# Patient Record
Sex: Male | Born: 1990 | Race: Black or African American | Hispanic: No | Marital: Married | Smoking: Never smoker
Health system: Southern US, Community
[De-identification: ages and names within clinical notes are randomized; demographics above are authoritative.]

## PROBLEM LIST (undated history)

## (undated) HISTORY — PX: NO PAST SURGERIES: SHX2092

---
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
## 2022-03-20 ENCOUNTER — Encounter (HOSPITAL_COMMUNITY): Payer: Self-pay

## 2022-03-20 ENCOUNTER — Ambulatory Visit (HOSPITAL_COMMUNITY)
Admission: EM | Admit: 2022-03-20 | Discharge: 2022-03-20 | Disposition: A | Discharge status: Home / Self Care | Payer: 59 | Attending: Physician Assistant | Admitting: Physician Assistant

## 2022-03-20 DIAGNOSIS — K625 Hemorrhage of anus and rectum: Secondary | ICD-10-CM | POA: Insufficient documentation

## 2022-03-20 LAB — CBC
HCT: 42.9 % (ref 39.0–52.0)
Hemoglobin: 14.7 g/dL (ref 13.0–17.0)
MCH: 29.5 pg (ref 26.0–34.0)
MCHC: 34.3 g/dL (ref 30.0–36.0)
MCV: 86.1 fL (ref 80.0–100.0)
Platelets: 465 10*3/uL — ABNORMAL HIGH (ref 150–400)
RBC: 4.98 MIL/uL (ref 4.22–5.81)
RDW: 12 % (ref 11.5–15.5)
WBC: 5.5 10*3/uL (ref 4.0–10.5)
nRBC: 0 % (ref 0.0–0.2)

## 2022-03-20 MED ORDER — HYDROCORTISONE ACETATE 25 MG RE SUPP: 25.0000 mg | Freq: Two times a day (BID) | RECTAL | 0 refills | Status: DC

## 2022-03-20 NOTE — ED Provider Notes (Signed)
?[facility] ? ? ? ?CSN: [id number] ?Arrival date & time: 03/20/22  [id number] ? ? ?  ? ?History   ?Chief Complaint ?Chief Complaint  ?Patient presents with  ? Rectal Bleeding  ? ? ?HPI ?Antonio GuileJohn E Castro Jr. is a 32 y.o. male.  ? ?Patient presents today with a an episode of bright red blood per rectum that occurred yesterday.  Reports he felt the urge to defecate but was unable to pass any stool and when he went to wipe.  Blood on toilet tissue.  A small amount fell into the toilet bowl but it was not significant enough to change the color of water in the toilet bowl.  He denies any associated pain, nausea, vomiting, diarrhea, constipation.  He does have a history of irritable bowel as well as GERD and is followed by GI.  He has had colonoscopy and endoscopy that showed hemorrhoids but no other concerning findings.  He does not take blood thinning medication denies history of bleeding disorder.  Denies any chest pain, shortness of breath, lightheadedness, nausea, vomiting.  He denies history of ulcerative colitis or Crohn's disease.  He reports having a normal bowel movement without any associated bleeding earlier today. ? ? ?History reviewed. No pertinent past medical history. ? ?There are no problems to display for this patient. ? ? ?History reviewed. No pertinent surgical history. ? ? ? ? ?Home Medications   ? ?Prior to Admission medications   ?Medication Sig Start Date End Date Taking? Authorizing Provider  ?hydrocortisone (ANUSOL-HC) 25 MG suppository Place 1 suppository (25 mg total) rectally 2 (two) times daily. 03/20/22  Yes Lakeita Panther, Noberto RetortErin K, PA-C  ? ? ?Family History ?Family History  ?Problem Relation Age of Onset  ? Diabetes Mother   ? Diabetes Father   ? Hypertension Father   ? ? ?Social History ?Social History  ? ?Tobacco Use  ? Smoking status: Never  ? Smokeless tobacco: Never  ?Vaping Use  ? Vaping Use: Every day  ?Substance Use Topics  ? Alcohol use: Not Currently  ?  Comment: 4/7 days a week.  ? Drug  use: Never  ? ? ? ?Allergies   ?Kiwi extract ? ? ?Review of Systems ?Review of Systems  ?Constitutional:  Positive for activity change. Negative for appetite change, fatigue and fever.  ?Respiratory:  Negative for cough and shortness of breath.   ?Cardiovascular:  Negative for chest pain and palpitations.  ?Gastrointestinal:  Positive for blood in stool. Negative for abdominal pain, constipation, diarrhea, nausea, rectal pain and vomiting.  ?Neurological:  Negative for dizziness, weakness, light-headedness and headaches.  ? ? ?Physical Exam ?Triage Vital Signs ?ED Triage Vitals  ?Enc Vitals Group  ?   BP 03/20/22 1113 (!) 153/57  ?   Pulse Rate 03/20/22 1113 62  ?   Resp 03/20/22 1113 16  ?   Temp 03/20/22 1113 98.6 ?F (37 ?C)  ?   Temp Source 03/20/22 1113 Oral  ?   SpO2 03/20/22 1113 97 %  ?   Weight --   ?   Height --   ?   Head Circumference --   ?   Peak Flow --   ?   Pain Score 03/20/22 1110 0  ?   Pain Loc --   ?   Pain Edu? --   ?   Excl. in GC? --   ? ?No data found. ? ?Updated Vital Signs ?BP (!) 153/57 (BP Location: Left Arm)   Pulse 62  Temp 98.6 ?F (37 ?C) (Oral)   Resp 16   SpO2 97%  ? ?Visual Acuity ?Right Eye Distance:   ?Left Eye Distance:   ?Bilateral Distance:   ? ?Right Eye Near:   ?Left Eye Near:    ?Bilateral Near:    ? ?Physical Exam ?Vitals reviewed.  ?Constitutional:   ?   General: He is awake.  ?   Appearance: Normal appearance. He is well-developed. He is not ill-appearing.  ?   Comments: Very pleasant male appears stated age in no acute distress sitting comfortably in exam room  ?HENT:  ?   Head: Normocephalic and atraumatic.  ?   Mouth/Throat:  ?   Pharynx: No oropharyngeal exudate, posterior oropharyngeal erythema or uvula swelling.  ?Cardiovascular:  ?   Rate and Rhythm: Normal rate and regular rhythm.  ?   Heart sounds: Normal heart sounds, S1 normal and S2 normal. No murmur heard. ?Pulmonary:  ?   Effort: Pulmonary effort is normal.  ?   Breath sounds: Normal breath sounds. No  stridor. No wheezing, rhonchi or rales.  ?   Comments: Clear auscultation bilaterally ?Abdominal:  ?   General: Bowel sounds are normal.  ?   Palpations: Abdomen is soft.  ?   Tenderness: There is no abdominal tenderness. There is no right CVA tenderness, left CVA tenderness, guarding or rebound.  ?   Comments: Benign abdominal exam  ?Genitourinary: ?   Rectum: No mass, tenderness, anal fissure, external hemorrhoid or internal hemorrhoid.  ?   Comments: Dee, CMA present as chaperone during exam. ?Neurological:  ?   Mental Status: He is alert.  ?Psychiatric:     ?   Behavior: Behavior is cooperative.  ? ? ? ?UC Treatments / Results  ?Labs ?(all labs ordered are listed, but only abnormal results are displayed) ?Labs Reviewed  ?CBC  ? ? ?EKG ? ? ?Radiology ?No results found. ? ?Procedures ?Procedures (including critical care time) ? ?Medications Ordered in UC ?Medications - No data to display ? ?Initial Impression / Assessment and Plan / UC Course  ?I have reviewed the triage vital signs and the nursing notes. ? ?Pertinent labs & imaging results that were available during my care of the patient were reviewed by me and considered in my medical decision making (see chart for details). ? ?  ? ?Physical exam is benign.  Patient is well-appearing, afebrile, nontachycardic, nontoxic.  Unclear etiology of symptoms but no evidence of persistent bleeding.  CBC obtained today-results pending.  Discussed that symptoms could be related to hemorrhoids and he was prescribed hydrocortisone suppositories to be used twice daily.  Recommended he avoid constipation by increasing fiber and fluid.  He is currently established with GI specialist and was encouraged to call to schedule an appointment with them as soon as possible.  Discussed that if he has any recurrent or worsening symptoms he needs to go to to the emergency room.  Strict return precautions given to which he expressed understanding.  Work excuse note provided. ? ?Final  Clinical Impressions(s) / UC Diagnoses  ? ?Final diagnoses:  ?BRBPR (bright red blood per rectum)  ? ? ? ?Discharge Instructions   ? ?  ?I will contact you if your blood counts are abnormal.  Please increase fiber in your diet and drink plenty fluids to avoid constipation which could exacerbate symptoms.  Use hydrocortisone suppositories to manage any hemorrhoids.  Please call your GI specialist to schedule an appointment as soon as possible.  If you  have recurrent episodes or worsening symptoms you need to be reevaluated. ? ? ? ? ?ED Prescriptions   ? ? Medication Sig Dispense Auth. Provider  ? hydrocortisone (ANUSOL-HC) 25 MG suppository Place 1 suppository (25 mg total) rectally 2 (two) times daily. 12 suppository Alyshia Kernan K, PA-C  ? ?  ? ?PDMP not reviewed this encounter. ?  ?Jeani Hawking, PA-C ?03/20/22 1154 ? ?

## 2022-03-20 NOTE — Discharge Instructions (Signed)
I will contact you if your blood counts are abnormal.  Please increase fiber in your diet and drink plenty fluids to avoid constipation which could exacerbate symptoms.  Use hydrocortisone suppositories to manage any hemorrhoids.  Please call your GI specialist to schedule an appointment as soon as possible.  If you have recurrent episodes or worsening symptoms you need to be reevaluated. ?

## 2022-03-20 NOTE — ED Triage Notes (Signed)
Pt presents with c/o rectal bleeding since last night.  ? ?Pt states he thought he had a BM and states he only saw blood. Pt denies rectal pain. Reports diarrhea, denies N/V.  ?

## 2022-04-19 NOTE — Progress Notes (Signed)
? ? ? ?04/20/2022 ?Caralyn Guile. ?[id number] ?07-31-1990 ? ?Primary GI doctor: Dr. Retia Passe ? ?ASSESSMENT AND PLAN:  ? ?Harpreet was seen today for rectal bleeding. ? ?Hemorrhoids, unspecified hemorrhoid type ?-     hydrocortisone (ANUSOL-HC) 2.5 % rectal cream; Place 1 application. rectally 2 (two) times daily as needed for hemorrhoids. ?-Sitz baths, increase fiber, increase water, need to fix toilet habits.  ?-Hydrocortisone supp give and external cream sent in.  ?-We discussed hemorrhoid banding here in the office treatment if hemorrhoids do not improve with conservative treatment. ?- follow up for evaluation with Dr. Adela Lank, can evaluate for possible banding in the office. ? ?Chronic idiopathic constipation ?-     DG Abd 1 View; Future ?07/01/2020 Colon pathology reviewed showed colonic mucosa random biopsies negative, hemorhroids. EGD neg H pylori gastiris, normal small bowel no celiac, negative EOE some mild esophagitis.  ?- Increase fiber/ water intake, decrease caffeine, increase activity level. ?? From working out less, normal colon 2021 other than hemorrhoids ?-Will add on Miralax daily and Benefiber- if not helping can consider linzess. ? ?HISTORY OF PRESENT ILLNESS: ?32 y.o. male with a past medical history of GERD, IBS and others listed below, presents for ER follow up on 03/20/22 for rectal bleeding.  ? ?He is out of the [employer] x 3 months.  ?For the past month he has had worsening constipation, BM q 3 days, loose.  ?2 Days ago he had mucus and BRB in the toliet with BM.  ?No straining.  ?No rectal pain.  ?No new medications, no supplements.  ?He has not been working out as much, diet is the same, has tried to increase water.  ?He has had gas, AB bloating. No AB pain.  ?Did no pick up suppositories from ER.  ?Has history of GERD but none at this time.  ?No weight loss.  ? ?CBC shows hemoglobin 14.7, white blood cell count 5.5, platelets 465 ?[facility]   ?07/01/2020 Colon pathology reviewed showed colonic mucosa random biopsies negative, hemorhroids. EGD neg H pylori gastiris, normal small bowel no celiac, negative EOE some mild esophagitis.  ? ? ?Current Medications:  ? ? ? ? ? ? ?Current Outpatient Medications (Other):  ?  hydrocortisone (ANUSOL-HC) 2.5 % rectal cream, Place 1 application. rectally 2 (two) times daily as needed for hemorrhoids. ?  hydrocortisone (ANUSOL-HC) 25 MG suppository, Place 1 suppository (25 mg total) rectally 2 (two) times daily. ? ?Medical History: History reviewed. No pertinent past medical history. ?Allergies:  ?Allergies  ?Allergen Reactions  ? Kiwi Extract   ?  ? ?Surgical History:  ?He  has a past surgical history that includes No past surgeries. ?Family History:  ?His family history includes Diabetes in his father and mother; Hypertension in his father. ?Social History:  ? reports that he has never smoked. He has never used smokeless tobacco. He reports current alcohol use. He reports that he does not use drugs. ? ?REVIEW OF SYSTEMS  : All other systems reviewed and negative except where noted in the History of Present Illness. ? ? ?PHYSICAL EXAM: ?BP 126/74   Pulse 67   Ht 5\' 9"  (1.753 m)   Wt 214 lb 12.8 oz (97.4 kg)   SpO2 98%   BMI 31.72 kg/m?  ?General:   Pleasant, well developed male in no acute distress ?Head:  Normocephalic and atraumatic. ?Eyes: sclerae anicteric,conjunctive pink  ?Heart:  regular rate and rhythm ?Pulm: Clear anteriorly; no wheezing ?Abdomen:  Soft, Non-distended AB, Active bowel sounds. No tenderness . , No organomegaly appreciated. ?Rectal: Normal external rectal exam, normal rectal tone, appreciated internal hemorrhoids, non-tender, no masses, , brown stool, hemoccult Negative ?Extremities:  Without edema. ?Msk:  Symmetrical without gross deformities. Peripheral pulses intact.  ?Neurologic:  Alert and  oriented x4;  No focal deficits.  ?Skin:   Dry and intact without significant lesions or  rashes. ?Psychiatric: Cooperative. Normal mood and affect. ? ? ? ?Doree Albee, PA-C ?12:00 PM ? ? ?

## 2022-04-20 ENCOUNTER — Ambulatory Visit: Payer: 59 | Admitting: Physician Assistant

## 2022-04-20 ENCOUNTER — Ambulatory Visit (INDEPENDENT_AMBULATORY_CARE_PROVIDER_SITE_OTHER)
Admission: RE | Admit: 2022-04-20 | Discharge: 2022-04-20 | Disposition: A | Discharge status: Home / Self Care | Payer: 59 | Source: Ambulatory Visit | Attending: Physician Assistant | Admitting: Physician Assistant

## 2022-04-20 ENCOUNTER — Encounter: Payer: Self-pay | Admitting: Physician Assistant

## 2022-04-20 VITALS — BP 126/74 | HR 67 | Ht 69.0 in | Wt 214.8 lb

## 2022-04-20 DIAGNOSIS — K5904 Chronic idiopathic constipation: Secondary | ICD-10-CM

## 2022-04-20 DIAGNOSIS — K649 Unspecified hemorrhoids: Secondary | ICD-10-CM

## 2022-04-20 MED ORDER — HYDROCORTISONE (PERIANAL) 2.5 % EX CREA: 1.0000 "application " | TOPICAL_CREAM | Freq: Two times a day (BID) | CUTANEOUS | 2 refills | Status: DC | PRN

## 2022-04-20 NOTE — Patient Instructions (Addendum)
Recommend starting on a fiber supplement, can try metamucil first but if this causes gas/bloating switch to benefiber ?Take with fiber with with a full 8 oz glass of water once a day. This can take 1 month to start helping, so try for at least one month.  ?Recommend increasing water and activity.  ?Get a squatty potty to use at home or try a stool, goal is to get your knees above your hips during a bowel movement. ? ?Miralax is an osmotic laxative.  ?It only brings more water into the stool.  ?This is safe to take daily.  ?Can take up to 17 gram of miralax twice a day.  ?Mix with juice or coffee.  ?Start 1/2- 1 capful at night for 3-4 days and reassess your response in 3-4 days.  ?You can increase and decrease the dose based on your response.  ?Remember, it can take up to 3-4 days to take effect OR for the effects to wear off.  ? ?I often pair this with benefiber in the morning to help assure the stool is not too loose.  ? ?- Drink at least 64-80 ounces of water/liquid per day. ?- Establish a time to try to move your bowels every day.  For many people, this is after a cup of coffee or after a meal such as breakfast. ?- Sit all of the way back on the toilet keeping your back fairly straight and while sitting up, try to rest the tops of your forearms on your upper thighs.   ?- Raising your feet with a step stool/squatty potty can be helpful to improve the angle that allows your stool to pass through the rectum. ?- Relax the rectum feeling it bulge toward the toilet water.  If you feel your rectum raising toward your body, you are contracting rather than relaxing. ?- Breathe in and slowly exhale. "Belly breath" by expanding your belly towards your belly button. Keep belly expanded as you gently direct pressure down and back to the anus.  A low pitched GRRR sound can assist with increasing intra-abdominal pressure.  ?- Repeat 3-4 times. If unsuccessful, contract the pelvic floor to restore normal tone and get off the  toilet.  Avoid excessive straining. ?- To reduce excessive wiping by teaching your anus to normally contract, place hands on outer aspect of knees and resist knee movement outward.  Hold 5-10 second then place hands just inside of knees and resist inward movement of knees.  Hold 5 seconds.  Repeat a few times each way. ? ?Go to the ER if unable to pass gas, severe AB pain, unable to hold down food, any shortness of breath of chest pain.  ? ?If the hemorrhoid suppository sent in is too expensive you can do this over the counter trick.  ?Apply a pea size amount of over the counter Anusol HC cream to the tip of an over the counter PrepH suppository and insert rectally once every night for at least 7 nights.  ? ?Please do sitz baths, increase fiber or add benefiber, increase water and increase acitivity.  ?Will send in hydrocoritsone suppository, cheapest with [employer] from [employer], [employer], [employer] or [employer] if your insurance does not pay for it. ? ?About Hemorrhoids ? ?Hemorrhoids are swollen veins in the lower rectum and anus.  Also called piles, hemorrhoids are a common problem.  Hemorrhoids may be internal (inside the rectum) or external (around the anus). ? ?Internal Hemorrhoids ? ?Internal hemorrhoids are often painless, but they rarely  cause bleeding.  The internal veins may stretch and fall down (prolapse) through the anus to the outside of the body.  The veins may then become irritated and painful. ? ?External Hemorrhoids ? ?External hemorrhoids can be easily seen or felt around the anal opening.  They are under the skin around the anus.  When the swollen veins are scratched or broken by straining, rubbing or wiping they sometimes bleed. ? ?How Hemorrhoids Occur ? ?Veins in the rectum and around the anus tend to swell under pressure.  Hemorrhoids can result from increased pressure in the veins of your anus or rectum.  Some sources of pressure are: ? ?Straining to have a bowel movement because of  constipation ?Waiting too long to have a bowel movement ?Coughing and sneezing often ?Sitting for extended periods of time, including on the toilet ?Diarrhea ?Obesity ?Trauma or injury to the anus ?Some liver diseases ?Stress ?Family history of hemorrhoids ?Pregnancy ? Pregnant women should try to avoid becoming constipated, because they are more likely to have hemorrhoids during pregnancy.  In the last trimester of pregnancy, the enlarged uterus may press on blood vessels and causes hemorrhoids.  In addition, the strain of childbirth sometimes causes hemorrhoids after the birth. ? ?Symptoms of Hemorrhoids ? ?Some symptoms of hemorrhoids include: ?Swelling and/or a tender lump around the anus ?Itching, mild burning and bleeding around the anus ?Painful bowel movements with or without constipation ?Bright red blood covering the stool, on toilet paper or in the toilet bowel.  ? ?Symptoms usually go away within a few days.  Always talk to your doctor about any bleeding to make sure it is not from some other causes. ? ?Diagnosing and Treating Hemorrhoids ? ?Diagnosis is made by an examination by your healthcare provider.  Special test can be performed by your doctor.   ? ?Most cases of hemorrhoids can be treated with: ?High-fiber diet: Eat more high-fiber foods, which help prevent constipation.  Ask for more detailed fiber information on types and sources of fiber from your healthcare provider. ?Fluids: Drink plenty of water.  This helps soften bowel movements so they are easier to pass. ?Sitz baths and cold packs: Sitting in lukewarm water two or three times a day for 15 minutes cleases the anal area and may relieve discomfort.  If the water is too hot, swelling around the anus will get worse.  Placing a cloth-covered ice pack on the anus for ten minutes four times a day can also help reduce selling.  Gently pushing a prolapsed hemorrhoid back inside after the bath or ice pack can be helpful. ?Medications: For mild  discomfort, your healthcare provider may suggest over-the-counter pain medication or prescribe a cream or ointment for topical use.  The cream may contain witch hazel, zinc oxide or petroleum jelly.  Medicated suppositories are also a treatment option.  Always consult your doctor before applying medications or creams. ?Procedures and surgeries: There are also a number of procedures and surgeries to shrink or remove hemorrhoids in more serious cases.  Talk to your physician about these options. ? ?You can often prevent hemorrhoids or keep them from becoming worse by maintaining a healthy lifestyle.  Eat a fiber-rich diet of fruits, vegetables and whole grains.  Also, drink plenty of water and exercise regularly. ? ?? 2007, [employer] Doc.30 ? ?

## 2022-04-20 NOTE — Progress Notes (Signed)
Agree with assessment and plan as outlined.  

## 2022-06-20 ENCOUNTER — Ambulatory Visit: Payer: 59 | Admitting: Gastroenterology

## 2022-09-06 ENCOUNTER — Ambulatory Visit (HOSPITAL_COMMUNITY)
Admission: EM | Admit: 2022-09-06 | Discharge: 2022-09-06 | Disposition: A | Discharge status: Home / Self Care | Payer: 59 | Attending: Internal Medicine | Admitting: Internal Medicine

## 2022-09-06 ENCOUNTER — Encounter (HOSPITAL_COMMUNITY): Payer: Self-pay | Admitting: Emergency Medicine

## 2022-09-06 ENCOUNTER — Ambulatory Visit (INDEPENDENT_AMBULATORY_CARE_PROVIDER_SITE_OTHER): Payer: 59

## 2022-09-06 DIAGNOSIS — S92424A Nondisplaced fracture of distal phalanx of right great toe, initial encounter for closed fracture: Secondary | ICD-10-CM

## 2022-09-06 DIAGNOSIS — S92404A Nondisplaced unspecified fracture of right great toe, initial encounter for closed fracture: Secondary | ICD-10-CM

## 2022-09-06 MED ORDER — IBUPROFEN 600 MG PO TABS
600.0000 mg | ORAL_TABLET | Freq: Four times a day (QID) | ORAL | 0 refills | Status: DC | PRN
Start: 2022-09-06 — End: 2023-05-14

## 2022-09-06 NOTE — ED Provider Notes (Signed)
[facility]    CSN: [id number] Arrival date & time: 09/06/22  3716      History   Chief Complaint Chief Complaint  Patient presents with   Toe Pain    HPI Antonio Tracz. is a 32 y.o. male comes to the urgent care with painful swelling of the right great toe.  Patient was running down the stairs when he missed a step and struck his toe against the stairs.  Pain is severe, aggravated by touching or movement with no known relieving factors.  No bruising noted.  No numbness or tingling.  Patient is unable to bear weight on the right foot.Marland Kitchen   HPI  History reviewed. No pertinent past medical history.  There are no problems to display for this patient.   Past Surgical History:  Procedure Laterality Date   NO PAST SURGERIES         Home Medications    Prior to Admission medications   Medication Sig Start Date End Date Taking? Authorizing Provider  ibuprofen (ADVIL) 600 MG tablet Take 1 tablet (600 mg total) by mouth every 6 (six) hours as needed. 09/06/22  Yes Cally Nygard, Britta Mccreedy, MD    Family History Family History  Problem Relation Age of Onset   Diabetes Mother    Diabetes Father    Hypertension Father    Colon cancer Neg Hx    Stomach cancer Neg Hx    Esophageal cancer Neg Hx    Colon polyps Neg Hx     Social History Social History   Tobacco Use   Smoking status: Never   Smokeless tobacco: Never  Vaping Use   Vaping Use: Every day  Substance Use Topics   Alcohol use: Yes    Comment: 4/7 days a week.   Drug use: Never     Allergies   Kiwi extract   Review of Systems Review of Systems  Gastrointestinal: Negative.   Musculoskeletal:  Positive for arthralgias and joint swelling. Negative for neck pain and neck stiffness.  Skin: Negative.  Negative for color change and wound.  Neurological: Negative.      Physical Exam Triage Vital Signs ED Triage Vitals  Enc Vitals Group     BP 09/06/22 0833 122/77     Pulse Rate 09/06/22 0833 73      Resp 09/06/22 0833 16     Temp 09/06/22 0833 98.4 F (36.9 C)     Temp Source 09/06/22 0833 Oral     SpO2 09/06/22 0833 99 %     Weight --      Height --      Head Circumference --      Peak Flow --      Pain Score 09/06/22 0831 8     Pain Loc --      Pain Edu? --      Excl. in GC? --    No data found.  Updated Vital Signs BP 122/77 (BP Location: Left Arm)   Pulse 73   Temp 98.4 F (36.9 C) (Oral)   Resp 16   SpO2 99%   Visual Acuity Right Eye Distance:   Left Eye Distance:   Bilateral Distance:    Right Eye Near:   Left Eye Near:    Bilateral Near:     Physical Exam Vitals and nursing note reviewed.  Constitutional:      General: He is not in acute distress.    Appearance: He is not ill-appearing.  Cardiovascular:  Rate and Rhythm: Normal rate and regular rhythm.  Musculoskeletal:     Comments: Limited range of motion of the right total.  No bruising noted.  Neurological:     Mental Status: He is alert.      UC Treatments / Results  Labs (all labs ordered are listed, but only abnormal results are displayed) Labs Reviewed - No data to display  EKG   Radiology DG Toe Great Right  Result Date: 09/06/2022 CLINICAL DATA:  Right great toe pain, injury EXAM: RIGHT GREAT TOE COMPARISON:  None Available. FINDINGS: Acute nondisplaced fracture of the base of the great toe distal phalanx with intra-articular extension to the interphalangeal joint. No dislocation. No additional fractures. Joint spaces are maintained. Soft tissue swelling at the fracture site. IMPRESSION: Acute nondisplaced intra-articular fracture of the great toe distal phalanx. Electronically Signed   By: Duanne Guess D.O.   On: 09/06/2022 09:14    Procedures Procedures (including critical care time)  Medications Ordered in UC Medications - No data to display  Initial Impression / Assessment and Plan / UC Course  I have reviewed the triage vital signs and the nursing  notes.  Pertinent labs & imaging results that were available during my care of the patient were reviewed by me and considered in my medical decision making (see chart for details).     1.  Nondisplaced intra-articular fracture of the right great toe: X-ray of the right great toe is significant for nondisplaced intra-articular fracture of the distal phalanx. Postop shoe Ibuprofen as needed for pain Icing of the right great toe Follow-up with orthopedic surgery in the coming week. Return to urgent care if symptoms worsen. Final Clinical Impressions(s) / UC Diagnoses   Final diagnoses:  Closed nondisplaced fracture of phalanx of right great toe, unspecified phalanx, initial encounter     Discharge Instructions      Rest and elevate the affected painful area.   Apply cold compresses intermittently as needed.   As pain recedes, begin normal activities slowly as tolerated.   Return to symptoms persist.    ED Prescriptions     Medication Sig Dispense Auth. Provider   ibuprofen (ADVIL) 600 MG tablet Take 1 tablet (600 mg total) by mouth every 6 (six) hours as needed. 30 tablet Labrenda Lasky, Britta Mccreedy, MD      PDMP not reviewed this encounter.   Merrilee Jansky, MD 09/06/22 [id number]

## 2022-09-06 NOTE — Discharge Instructions (Addendum)
Rest and elevate the affected painful area.   Apply cold compresses intermittently as needed.   As pain recedes, begin normal activities slowly as tolerated.   Return to symptoms persist.

## 2022-09-06 NOTE — ED Triage Notes (Signed)
Pt injured right big toe last night by running it into something. Pt reports swelling to right big toe and pain.

## 2022-11-19 ENCOUNTER — Telehealth: Payer: 59 | Admitting: Physician Assistant

## 2022-11-19 ENCOUNTER — Telehealth: Payer: 59 | Admitting: Family Medicine

## 2022-11-19 DIAGNOSIS — U071 COVID-19: Secondary | ICD-10-CM | POA: Diagnosis not present

## 2022-11-19 MED ORDER — NIRMATRELVIR/RITONAVIR (PAXLOVID)TABLET: 3.0000 | ORAL_TABLET | Freq: Two times a day (BID) | ORAL | 0 refills | Status: AC

## 2022-11-19 MED ORDER — ONDANSETRON HCL 4 MG PO TABS: 4.0000 mg | ORAL_TABLET | Freq: Three times a day (TID) | ORAL | 0 refills | Status: DC | PRN

## 2022-11-19 MED ORDER — BENZONATATE 100 MG PO CAPS: 100.0000 mg | ORAL_CAPSULE | Freq: Three times a day (TID) | ORAL | 0 refills | Status: DC | PRN

## 2022-11-19 MED ORDER — PROMETHAZINE-DM 6.25-15 MG/5ML PO SYRP: 5.0000 mL | ORAL_SOLUTION | Freq: Four times a day (QID) | ORAL | 0 refills | Status: DC | PRN

## 2022-11-19 NOTE — Patient Instructions (Signed)
Antonio GuileJohn E Chism Jr., thank you for joining Antonio LovelessJennifer M Breland Elders, PA-C for today's virtual visit.  While this provider is not your primary care provider (PCP), if your PCP is located in our provider database this encounter information will be shared with them immediately following your visit.   A [facility] MyChart account gives you access to today's visit and all your visits, tests, and labs performed at [facility] " click here if you don't have a [facility] MyChart account or go to [email or web address]  Consent: (Patient) Antonio GuileJohn E Fedora Jr. provided verbal consent for this virtual visit at the beginning of the encounter.  Current Medications:  Current Outpatient Medications:    benzonatate (TESSALON) 100 MG capsule, Take 1 capsule (100 mg total) by mouth 3 (three) times daily as needed., Disp: 30 capsule, Rfl: 0   nirmatrelvir/ritonavir EUA (PAXLOVID) 20 x 150 MG & 10 x 100MG  TABS, Take 3 tablets by mouth 2 (two) times daily for 5 days. (Take nirmatrelvir 150 mg two tablets twice daily for 5 days and ritonavir 100 mg one tablet twice daily for 5 days), Disp: 30 tablet, Rfl: 0   ondansetron (ZOFRAN) 4 MG tablet, Take 1 tablet (4 mg total) by mouth every 8 (eight) hours as needed for nausea or vomiting., Disp: 20 tablet, Rfl: 0   promethazine-dextromethorphan (PROMETHAZINE-DM) 6.25-15 MG/5ML syrup, Take 5 mLs by mouth 4 (four) times daily as needed., Disp: 118 mL, Rfl: 0   ibuprofen (ADVIL) 600 MG tablet, Take 1 tablet (600 mg total) by mouth every 6 (six) hours as needed., Disp: 30 tablet, Rfl: 0   Medications ordered in this encounter:  Meds ordered this encounter  Medications   ondansetron (ZOFRAN) 4 MG tablet    Sig: Take 1 tablet (4 mg total) by mouth every 8 (eight) hours as needed for nausea or vomiting.    Dispense:  20 tablet    Refill:  0    Order Specific Question:   Supervising Provider    Answer:   Merrilee JanskyLAMPTEY, PHILIP O [id number]]   promethazine-dextromethorphan  (PROMETHAZINE-DM) 6.25-15 MG/5ML syrup    Sig: Take 5 mLs by mouth 4 (four) times daily as needed.    Dispense:  118 mL    Refill:  0    Order Specific Question:   Supervising Provider    Answer:   Merrilee JanskyLAMPTEY, PHILIP O [[id number]]   benzonatate (TESSALON) 100 MG capsule    Sig: Take 1 capsule (100 mg total) by mouth 3 (three) times daily as needed.    Dispense:  30 capsule    Refill:  0    Order Specific Question:   Supervising Provider    Answer:   Merrilee JanskyLAMPTEY, PHILIP O [id number]]   nirmatrelvir/ritonavir EUA (PAXLOVID) 20 x 150 MG & 10 x 100MG  TABS    Sig: Take 3 tablets by mouth 2 (two) times daily for 5 days. (Take nirmatrelvir 150 mg two tablets twice daily for 5 days and ritonavir 100 mg one tablet twice daily for 5 days)    Dispense:  30 tablet    Refill:  0    Order Specific Question:   Supervising Provider    Answer:   Merrilee JanskyLAMPTEY, PHILIP O [id number]]     *If you need refills on other medications prior to your next appointment, please contact your pharmacy*  Follow-Up: Call back or seek an in-person evaluation if the symptoms worsen or if the condition fails to improve as anticipated.  [address] [facility] [phone]  Other  Instructions  Paxlovid-Nirmatrelvir; Ritonavir Tablets What is this medication? NIRMATRELVIR; RITONAVIR (NIR ma TREL vir; ri TOE na veer) treats mild to moderate COVID-19. It may help people who are at high risk of developing severe illness. It works by limiting the spread of the virus in your body. This medicine may be used for other purposes; ask your health care provider or pharmacist if you have questions. COMMON BRAND NAME(S): PAXLOVID What should I tell my care team before I take this medication? They need to know if you have any of these conditions: Any allergies Any serious illness Kidney disease Liver disease An unusual or allergic reaction to nirmatrelvir, ritonavir, other medications, foods, dyes, or preservatives Pregnant or trying to get  pregnant Breast-feeding How should I use this medication? This product contains 2 different medications that are packaged together. For the standard dose, take 2 pink tablets of nirmatrelvir with 1 white tablet of ritonavir (3 tablets total) by mouth with water twice daily. Talk to your care team if you have kidney disease. You may need a different dose. Swallow the tablets whole. You can take it with or without food. If it upsets your stomach, take it with food. Take all of this medication unless your care team tells you to stop it early. Keep taking it even if you think you are better. Talk to your care team about the use of this medication in children. While it may be prescribed for children as young as 12 years for selected conditions, precautions do apply. Overdosage: If you think you have taken too much of this medicine contact a poison control center or emergency room at once. NOTE: This medicine is only for you. Do not share this medicine with others. What if I miss a dose? If you miss a dose, take it as soon as you can unless it is more than 8 hours late. If it is more than 8 hours late, skip the missed dose. Take the next dose at the normal time. Do not take extra or 2 doses at the same time to make up for the missed dose. What may interact with this medication? Do not take this medication with any of the following medications: Alfuzosin Certain medications for anxiety or sleep like midazolam, triazolam Certain medications for cancer like apalutamide, enzalutamide Certain medications for cholesterol like lovastatin, simvastatin Certain medications for irregular heart beat like amiodarone, dronedarone, flecainide, propafenone, quinidine Certain medications for pain like meperidine, piroxicam Certain medications for psychotic disorders like clozapine, lurasidone, pimozide Certain medications for seizures like carbamazepine, phenobarbital, phenytoin Colchicine Eletriptan Eplerenone Ergot  alkaloids like dihydroergotamine, ergonovine, ergotamine, methylergonovine Finerenone Flibanserin Ivabradine Lomitapide Naloxegol Ranolazine Rifampin Sildenafil Silodosin St. Oskar's Wort Tolvaptan Ubrogepant Voclosporin This medication may also interact with the following medications: Bedaquiline Birth control pills Bosentan Certain antibiotics like erythromycin or clarithromycin Certain medications for blood pressure like amlodipine, diltiazem, felodipine, nicardipine, nifedipine Certain medications for cancer like abemaciclib, ceritinib, dasatinib, encorafenib, ibrutinib, ivosidenib, neratinib, nilotinib, venetoclax, vinblastine, vincristine Certain medications for cholesterol like atorvastatin, rosuvastatin Certain medications for depression like bupropion, trazodone Certain medications for fungal infections like isavuconazonium, itraconazole, ketoconazole, voriconazole Certain medications for hepatitis C like elbasvir; grazoprevir, dasabuvir; ombitasvir; paritaprevir; ritonavir, glecaprevir; pibrentasvir, sofosbuvir; velpatasvir; voxilaprevir Certain medications for HIV or AIDS Certain medications for irregular heartbeat like lidocaine Certain medications that treat or prevent blood clots like rivaroxaban, warfarin Digoxin Fentanyl Medications that lower your chance of fighting infection like cyclosporine, sirolimus, tacrolimus Methadone Quetiapine Rifabutin Salmeterol Steroid medications like betamethasone, budesonide, ciclesonide, dexamethasone,  fluticasone, methylprednisolone, mometasone, triamcinolone This list may not describe all possible interactions. Give your health care provider a list of all the medicines, herbs, non-prescription drugs, or dietary supplements you use. Also tell them if you smoke, drink alcohol, or use illegal drugs. Some items may interact with your medicine. What should I watch for while using this medication? Your condition will be monitored  carefully while you are receiving this medication. Visit your care team for regular checkups. Tell your care team if your symptoms do not start to get better or if they get worse. If you have untreated HIV infection, this medication may lead to some HIV medications not working as well in the future. Estrogen and progestin hormones may not work as well while you are taking this medication. Your care team can help you find the contraceptive option that works for you. What side effects may I notice from receiving this medication? Side effects that you should report to your care team as soon as possible: Allergic reactions--skin rash, itching, hives, swelling of the face, lips, tongue, or throat Liver injury--right upper belly pain, loss of appetite, nausea, light-colored stool, dark yellow or brown urine, yellowing skin or eyes, unusual weakness or fatigue Redness, blistering, peeling, or loosening of the skin, including inside the mouth Side effects that usually do not require medical attention (report these to your care team if they continue or are bothersome): Change in taste Diarrhea General discomfort and fatigue Increase in blood pressure Muscle pain Nausea Stomach pain This list may not describe all possible side effects. Call your doctor for medical advice about side effects. You may report side effects to FDA at 1-800-FDA-1088. Where should I keep my medication? Keep out of the reach of children and pets. Store at room temperature between 20 and 25 degrees C (68 and 77 degrees F). Get rid of any unused medication after the expiration date. To get rid of medications that are no longer needed or have expired: Take the medication to a medication take-back program. Check with your pharmacy or law enforcement to find a location. If you cannot return the medication, check the label or package insert to see if the medication should be thrown out in the garbage or flushed down the toilet. If you  are not sure, ask your care team. If it is safe to put it in the trash, take the medication out of the container. Mix the medication with cat litter, dirt, coffee grounds, or other unwanted substance. Seal the mixture in a bag or container. Put it in the trash. NOTE: This sheet is a summary. It may not cover all possible information. If you have questions about this medicine, talk to your doctor, pharmacist, or health care provider.  2023 Elsevier/Gold Standard (2020-12-26 00:00:00)   COVID-19 COVID-19, or coronavirus disease 2019, is an infection that is caused by a new (novel) coronavirus called SARS-CoV-2. COVID-19 can cause many symptoms. In some people, the virus may not cause any symptoms. In others, it may cause mild or severe symptoms. Some people with severe infection develop severe disease. What are the causes? This illness is caused by a virus. The virus may be in the air as tiny specks of fluid (aerosols) or droplets, or it may be on surfaces. You may catch the virus by: Breathing in droplets from an infected person. Droplets can be spread by a person breathing, speaking, singing, coughing, or sneezing. Touching something, like a table or a doorknob, that has virus on it (is contaminated)  and then touching your mouth, nose, or eyes. What increases the risk? Risk for infection: You are more likely to get infected with the COVID-19 virus if: You are within 6 ft (1.8 m) of a person with COVID-19 for 15 minutes or longer. You are providing care for a person who is infected with COVID-19. You are in close personal contact with other people. Close personal contact includes hugging, kissing, or sharing eating or drinking utensils. Risk for serious illness caused by COVID-19: You are more likely to get seriously ill from the COVID-19 virus if: You have cancer. You have a long-term (chronic) disease, such as: Chronic lung disease. This includes pulmonary embolism, chronic obstructive  pulmonary disease, and cystic fibrosis. Long-term disease that lowers your body's ability to fight infection (immunocompromise). Serious cardiac conditions, such as heart failure, coronary artery disease, or cardiomyopathy. Diabetes. Chronic kidney disease. Liver diseases. These include cirrhosis, nonalcoholic fatty liver disease, alcoholic liver disease, or autoimmune hepatitis. You have obesity. You are pregnant or were recently pregnant. You have sickle cell disease. What are the signs or symptoms? Symptoms of this condition can range from mild to severe. Symptoms may appear any time from 2 to 14 days after being exposed to the virus. They include: Fever or chills. Shortness of breath or trouble breathing. Feeling tired or very tired. Headaches, body aches, or muscle aches. Runny or stuffy nose, sneezing, coughing, or sore throat. New loss of taste or smell. This is rare. Some people may also have stomach problems, such as nausea, vomiting, or diarrhea. Other people may not have any symptoms of COVID-19. How is this diagnosed? This condition may be diagnosed by testing samples to check for the COVID-19 virus. The most common tests are the PCR test and the antigen test. Tests may be done in the lab or at home. They include: Using a swab to take a sample of fluid from the back of your nose and throat (nasopharyngeal fluid), from your nose, or from your throat. Testing a sample of saliva from your mouth. Testing a sample of coughed-up mucus from your lungs (sputum). How is this treated? Treatment for COVID-19 infection depends on the severity of the condition. Mild symptoms can be managed at home with rest, fluids, and over-the-counter medicines. Serious symptoms may be treated in a hospital intensive care unit (ICU). Treatment in the ICU may include: Supplemental oxygen. Extra oxygen is given through a tube in the nose, a face mask, or a hood. Medicines. These may include: Antivirals,  such as monoclonal antibodies. These help your body fight off certain viruses that can cause disease. Anti-inflammatories, such as corticosteroids. These reduce inflammation and suppress the immune system. Antithrombotics. These prevent or treat blood clots, if they develop. Convalescent plasma. This helps boost your immune system, if you have an underlying immunosuppressive condition or are getting immunosuppressive treatments. Prone positioning. This means you will lie on your stomach. This helps oxygen to get into your lungs. Infection control measures. If you are at risk for more serious illness caused by COVID-19, your health care provider may prescribe two long-acting monoclonal antibodies, given together every 6 months. How is this prevented? To protect yourself: Use preventive medicine (pre-exposure prophylaxis). You may get pre-exposure prophylaxis if you have moderate or severe immunocompromise. Get vaccinated. Anyone 68 months old or older who meets guidelines can get a COVID-19 vaccine or vaccine series. This includes people who are pregnant or making breast milk (lactating). Get an added dose of COVID-19 vaccine after your first  vaccine or vaccine series if you have moderate to severe immunocompromise. This applies if you have had a solid organ transplant or have been diagnosed with an immunocompromising condition. You should get the added dose 4 weeks after you got the first COVID-19 vaccine or vaccine series. If you get an mRNA vaccine, you will need a 3-dose primary series. If you get the J&J/Janssen vaccine, you will need a 2-dose primary series, with the second dose being an mRNA vaccine. Talk to your health care provider about getting experimental monoclonal antibodies. This treatment is approved under emergency use authorization to prevent severe illness before or after being exposed to the COVID-19 virus. You may be given monoclonal antibodies if: You have moderate or severe  immunocompromise. This includes treatments that lower your immune response. People with immunocompromise may not develop protection against COVID-19 when they are vaccinated. You cannot be vaccinated. You may not get a vaccine if you have a severe allergic reaction to the vaccine or its components. You are not fully vaccinated. You are in a facility where COVID-19 is present and: Are in close contact with a person who is infected with the COVID-19 virus. Are at high risk of being exposed to the COVID-19 virus. You are at risk of illness from new variants of the COVID-19 virus. To protect others: If you have symptoms of COVID-19, take steps to prevent the virus from spreading to others. Stay home. Leave your house only to get medical care. Do not use public transit, if possible. Do not travel while you are sick. Wash your hands often with soap and water for at least 20 seconds. If soap and water are not available, use alcohol-based hand sanitizer. Make sure that all people in your household wash their hands well and often. Cough or sneeze into a tissue or your sleeve or elbow. Do not cough or sneeze into your hand or into the air. Where to find more information Centers for Disease Control and Prevention: https://www.clark-whitaker.org/ World Health Organization: https://thompson-craig.com/ Get help right away if: You have trouble breathing. You have pain or pressure in your chest. You are confused. You have bluish lips and fingernails. You have trouble waking from sleep. You have symptoms that get worse. These symptoms may be an emergency. Get help right away. Call 911. Do not wait to see if the symptoms will go away. Do not drive yourself to the hospital. Summary COVID-19 is an infection that is caused by a new coronavirus. Sometimes, there are no symptoms. Other times, symptoms range from mild to severe. Some people with a severe COVID-19 infection develop severe disease. The virus  that causes COVID-19 can spread from person to person through droplets or aerosols from breathing, speaking, singing, coughing, or sneezing. Mild symptoms of COVID-19 can be managed at home with rest, fluids, and over-the-counter medicines. This information is not intended to replace advice given to you by your health care provider. Make sure you discuss any questions you have with your health care provider. Document Revised: 12/05/2021 Document Reviewed: 12/07/2021 Elsevier Patient Education  2023 Elsevier Inc.    If you have been instructed to have an in-person evaluation today at a local Urgent Care facility, please use the link below. It will take you to a list of all of our available [facility], including address, phone number and hours of operation. Please do not delay care.  [facility]  If you or a family member do not have a primary care provider,  use the link below to schedule a visit and establish care. When you choose a [facility] primary care physician or advanced practice provider, you gain a long-term partner in health. Find a Primary Care Provider  Learn more about 's in-office and virtual care options: [facility]

## 2022-11-19 NOTE — Progress Notes (Signed)
Thank you for the details you included in the comment boxes. Those details are very helpful in determining the best course of treatment for you and help us to provide the best care.Because COVID +, we recommend that you convert this visit to a video visit in order for the provider to better assess what is going on.  The provider will be able to give you a more accurate diagnosis and treatment plan if we can more freely discuss your symptoms and with the addition of a virtual examination.    If you convert to a video visit, we will bill your insurance (similar to an office visit) and you will not be charged for this e-Visit. You will be able to stay at home and speak with the first available [facility] advanced practice provider. The link to do a video visit is in the drop down Menu tab of your Welcome screen in MyChart.   

## 2022-11-19 NOTE — Progress Notes (Signed)
Virtual Visit Consent   Antonio Arner., you are scheduled for a virtual visit with a [facility] provider today. Just as with appointments in the office, your consent must be obtained to participate. Your consent will be active for this visit and any virtual visit you may have with one of our providers in the next 365 days. If you have a MyChart account, a copy of this consent can be sent to you electronically.  As this is a virtual visit, video technology does not allow for your provider to perform a traditional examination. This may limit your provider's ability to fully assess your condition. If your provider identifies any concerns that need to be evaluated in person or the need to arrange testing (such as labs, EKG, etc.), we will make arrangements to do so. Although advances in technology are sophisticated, we cannot ensure that it will always work on either your end or our end. If the connection with a video visit is poor, the visit may have to be switched to a telephone visit. With either a video or telephone visit, we are not always able to ensure that we have a secure connection.  By engaging in this virtual visit, you consent to the provision of healthcare and authorize for your insurance to be billed (if applicable) for the services provided during this visit. Depending on your insurance coverage, you may receive a charge related to this service.  I need to obtain your verbal consent now. Are you willing to proceed with your visit today? Antonio Quinn. has provided verbal consent on 11/19/2022 for a virtual visit (video or telephone). Mar Daring, PA-C  Date: 11/19/2022 12:58 PM  Virtual Visit via Video Note   I, Mar Daring, connected with  Antonio Quinn.  ([id number], 09-05-1990) on 11/19/22 at 12:45 PM EST by a video-enabled telemedicine application and verified that I am speaking with the correct person using two identifiers.  Location: Patient: Virtual Visit  Location Patient: Home Provider: Virtual Visit Location Provider: Home Office   I discussed the limitations of evaluation and management by telemedicine and the availability of in person appointments. The patient expressed understanding and agreed to proceed.    History of Present Illness: Antonio Ell. is a 32 y.o. who identifies as a male who was assigned male at birth, and is being seen today for Covid 24.  HPI: URI  This is a new problem. Episode onset: Tested positive for Covid 19 on at home yesterday; Symptoms started about 4 days ago. The problem has been gradually worsening. The maximum temperature recorded prior to his arrival was 101 - 101.9 F. The fever has been present for Less than 1 day. Associated symptoms include congestion, coughing, diarrhea, headaches, nausea, rhinorrhea, sinus pain, a sore throat and vomiting. Pertinent negatives include no ear pain or plugged ear sensation. Associated symptoms comments: Myalgias, chills. Treatments tried: tylenol cold and flu, mucinex, sudafed. The treatment provided no relief.     Problems: There are no problems to display for this patient.   Allergies:  Allergies  Allergen Reactions   Kiwi Extract    Medications:  Current Outpatient Medications:    benzonatate (TESSALON) 100 MG capsule, Take 1 capsule (100 mg total) by mouth 3 (three) times daily as needed., Disp: 30 capsule, Rfl: 0   nirmatrelvir/ritonavir EUA (PAXLOVID) 20 x 150 MG & 10 x 100MG  TABS, Take 3 tablets by mouth 2 (two) times daily for 5 days. (Take  nirmatrelvir 150 mg two tablets twice daily for 5 days and ritonavir 100 mg one tablet twice daily for 5 days), Disp: 30 tablet, Rfl: 0   ondansetron (ZOFRAN) 4 MG tablet, Take 1 tablet (4 mg total) by mouth every 8 (eight) hours as needed for nausea or vomiting., Disp: 20 tablet, Rfl: 0   promethazine-dextromethorphan (PROMETHAZINE-DM) 6.25-15 MG/5ML syrup, Take 5 mLs by mouth 4 (four) times daily as needed., Disp: 118 mL,  Rfl: 0   ibuprofen (ADVIL) 600 MG tablet, Take 1 tablet (600 mg total) by mouth every 6 (six) hours as needed., Disp: 30 tablet, Rfl: 0  Observations/Objective: Patient is well-developed, well-nourished in no acute distress.  Resting comfortably at home.  Head is normocephalic, atraumatic.  No labored breathing.  Speech is clear and coherent with logical content.  Patient is alert and oriented at baseline.    Assessment and Plan: 1. COVID-19 - ondansetron (ZOFRAN) 4 MG tablet; Take 1 tablet (4 mg total) by mouth every 8 (eight) hours as needed for nausea or vomiting.  Dispense: 20 tablet; Refill: 0 - promethazine-dextromethorphan (PROMETHAZINE-DM) 6.25-15 MG/5ML syrup; Take 5 mLs by mouth 4 (four) times daily as needed.  Dispense: 118 mL; Refill: 0 - benzonatate (TESSALON) 100 MG capsule; Take 1 capsule (100 mg total) by mouth 3 (three) times daily as needed.  Dispense: 30 capsule; Refill: 0 - nirmatrelvir/ritonavir EUA (PAXLOVID) 20 x 150 MG & 10 x 100MG  TABS; Take 3 tablets by mouth 2 (two) times daily for 5 days. (Take nirmatrelvir 150 mg two tablets twice daily for 5 days and ritonavir 100 mg one tablet twice daily for 5 days)  Dispense: 30 tablet; Refill: 0  - Continue OTC symptomatic management of choice - Will send OTC vitamins and supplement information through AVS - Paxlovid, Promethazine DM, Zofran and Tessalon perles prescribed - Patient was made aware of potential adverse renal and liver effects with Paxlovid without having labs; he voiced full understanding and still desired treatment - Push fluids - Rest as needed - Discussed return precautions and when to seek in-person evaluation, sent via AVS as well   Follow Up Instructions: I discussed the assessment and treatment plan with the patient. The patient was provided an opportunity to ask questions and all were answered. The patient agreed with the plan and demonstrated an understanding of the instructions.  A copy of  instructions were sent to the patient via MyChart unless otherwise noted below.    The patient was advised to call back or seek an in-person evaluation if the symptoms worsen or if the condition fails to improve as anticipated.  Time:  I spent 12 minutes with the patient via telehealth technology discussing the above problems/concerns.    , PA-C

## 2023-05-14 ENCOUNTER — Encounter: Payer: Self-pay | Admitting: Podiatry

## 2023-05-14 ENCOUNTER — Ambulatory Visit: Payer: BLUE CROSS/BLUE SHIELD | Admitting: Podiatry

## 2023-05-14 DIAGNOSIS — K649 Unspecified hemorrhoids: Secondary | ICD-10-CM | POA: Insufficient documentation

## 2023-05-14 DIAGNOSIS — L600 Ingrowing nail: Secondary | ICD-10-CM | POA: Diagnosis not present

## 2023-05-14 DIAGNOSIS — M62830 Muscle spasm of back: Secondary | ICD-10-CM | POA: Insufficient documentation

## 2023-05-14 DIAGNOSIS — Z0289 Encounter for other administrative examinations: Secondary | ICD-10-CM | POA: Insufficient documentation

## 2023-05-14 DIAGNOSIS — H52209 Unspecified astigmatism, unspecified eye: Secondary | ICD-10-CM | POA: Insufficient documentation

## 2023-05-14 DIAGNOSIS — J309 Allergic rhinitis, unspecified: Secondary | ICD-10-CM | POA: Insufficient documentation

## 2023-05-14 DIAGNOSIS — H40009 Preglaucoma, unspecified, unspecified eye: Secondary | ICD-10-CM | POA: Insufficient documentation

## 2023-05-14 MED ORDER — NEOMYCIN-POLYMYXIN-HC 1 % OT SOLN: OTIC | 1 refills | Status: DC

## 2023-05-14 NOTE — Patient Instructions (Signed)

## 2023-05-15 NOTE — Progress Notes (Signed)
  Subjective:  Patient ID: Antonio Quinn., male    DOB: Aug 09, 1990,  MRN: [id number] HPI Chief Complaint  Patient presents with   Toe Pain    Hallux right - lateral border, broke toe and messed up the toenail, tender x 2 months, tried trimming - no help   New Patient (Initial Visit)    33 y.o. male presents with the above complaint.   ROS: Denies fever chills nausea vomit muscle aches pains calf pain back pain chest pain shortness of breath.  No past medical history on file. Past Surgical History:  Procedure Laterality Date   NO PAST SURGERIES      Current Outpatient Medications:    NEOMYCIN-POLYMYXIN-HYDROCORTISONE (CORTISPORIN) 1 % SOLN OTIC solution, Apply 1-2 drops to toe BID after soaking, Disp: 10 mL, Rfl: 1  Allergies  Allergen Reactions   Kiwi Extract    Review of Systems Objective:  There were no vitals filed for this visit.  General: Well developed, nourished, in no acute distress, alert and oriented x3   Dermatological: Skin is warm, dry and supple bilateral. Nails x 10 are well maintained; remaining integument appears unremarkable at this time. There are no open sores, no preulcerative lesions, no rash or signs of infection present.  Mildly erythematous hallux fibular border nail plate.  No purulence there is some granulation tissue present.  Vascular: Dorsalis Pedis artery and Posterior Tibial artery pedal pulses are 2/4 bilateral with immedate capillary fill time. Pedal hair growth present. No varicosities and no lower extremity edema present bilateral.   Neruologic: Grossly intact via light touch bilateral. Vibratory intact via tuning fork bilateral. Protective threshold with Semmes Wienstein monofilament intact to all pedal sites bilateral. Patellar and Achilles deep tendon reflexes 2+ bilateral. No Babinski or clonus noted bilateral.   Musculoskeletal: No gross boney pedal deformities bilateral. No pain, crepitus, or limitation noted with foot and ankle range  of motion bilateral. Muscular strength 5/5 in all groups tested bilateral.  Gait: Unassisted, Nonantalgic.    Radiographs:  None taken  Assessment & Plan:   Assessment: Ingrown toenail hallux right fibular border.  Plan: Discussed etiology pathology conservative surgical therapies at this point matricectomy was performed Toller procedure well without complications was given both oral and home-going instructions as well as a prescription for Cortisporin Otic to be applied twice daily after soaking.  Follow-up with him in 2 weeks     Antonio Quinn T. [place], [place]

## 2023-07-11 ENCOUNTER — Emergency Department (HOSPITAL_COMMUNITY)
Admission: EM | Admit: 2023-07-11 | Discharge: 2023-07-11 | Disposition: A | Discharge status: Home / Self Care | Payer: BLUE CROSS/BLUE SHIELD | Attending: Emergency Medicine | Admitting: Emergency Medicine

## 2023-07-11 ENCOUNTER — Other Ambulatory Visit: Payer: Self-pay

## 2023-07-11 ENCOUNTER — Encounter (HOSPITAL_COMMUNITY): Payer: Self-pay | Admitting: Emergency Medicine

## 2023-07-11 DIAGNOSIS — W2210XA Striking against or struck by unspecified automobile airbag, initial encounter: Secondary | ICD-10-CM | POA: Insufficient documentation

## 2023-07-11 DIAGNOSIS — R600 Localized edema: Secondary | ICD-10-CM | POA: Insufficient documentation

## 2023-07-11 DIAGNOSIS — M79602 Pain in left arm: Secondary | ICD-10-CM | POA: Diagnosis present

## 2023-07-11 DIAGNOSIS — T22012A Burn of unspecified degree of left forearm, initial encounter: Secondary | ICD-10-CM | POA: Diagnosis not present

## 2023-07-11 DIAGNOSIS — M542 Cervicalgia: Secondary | ICD-10-CM | POA: Diagnosis not present

## 2023-07-11 DIAGNOSIS — Y92410 Unspecified street and highway as the place of occurrence of the external cause: Secondary | ICD-10-CM | POA: Insufficient documentation

## 2023-07-11 MED ORDER — METHOCARBAMOL 500 MG PO TABS
500.0000 mg | ORAL_TABLET | Freq: Two times a day (BID) | ORAL | 0 refills | Status: DC
Start: 2023-07-11 — End: 2024-09-02

## 2023-07-11 NOTE — Discharge Instructions (Addendum)
You will hurt worse tomorrow.  That is normal.  Please return for headache confusion vomiting or right arm numbness or weakness. Take 4 over the counter ibuprofen tablets 3 times a day or 2 over-the-counter naproxen tablets twice a day for pain. Also take tylenol 1000mg (2 extra strength) four times a day.
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
## 2023-07-31 IMAGING — DX DG ABDOMEN 1V
2 series · 2 of 2 positions shown · non-contrast
Comparison: None.

CLINICAL DATA: Evaluate stool burden/obstruction. Intermittent
constipation for 1 month

EXAM:
ABDOMEN - 1 VIEW

[abdomen kub (1 of 2)]
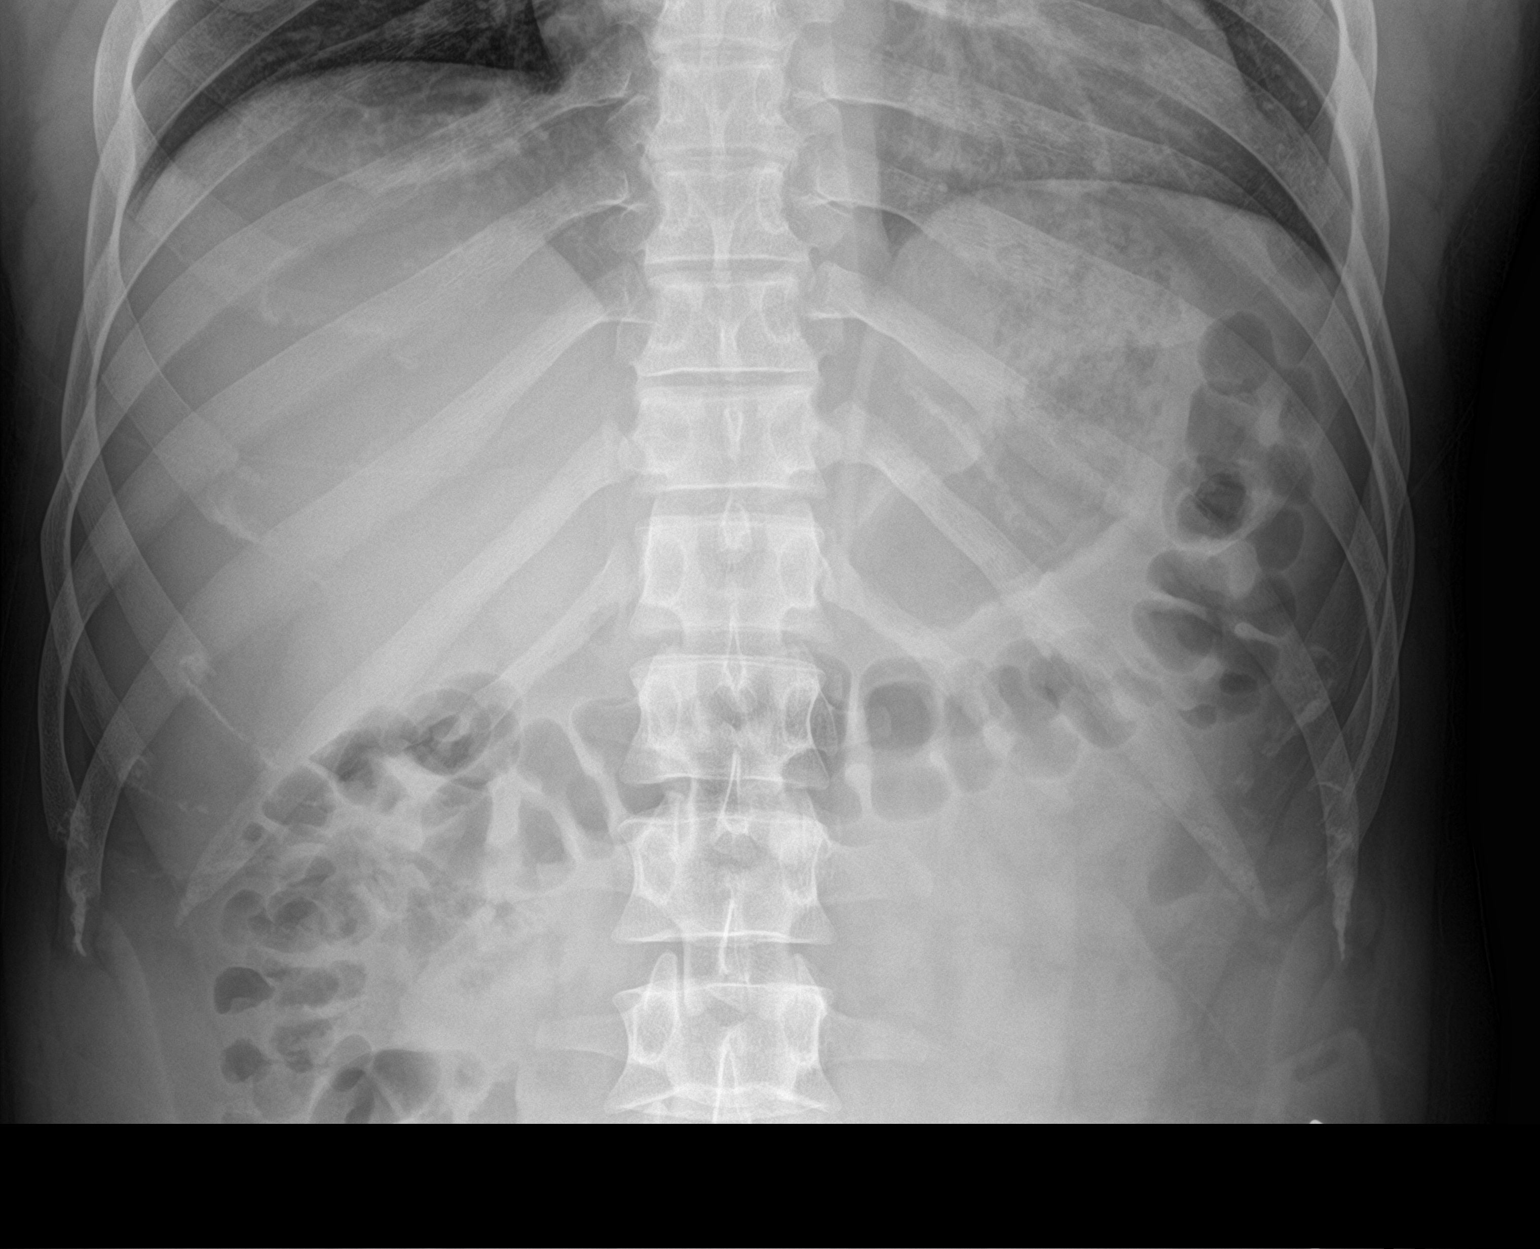

[abdomen kub (2 of 2)]
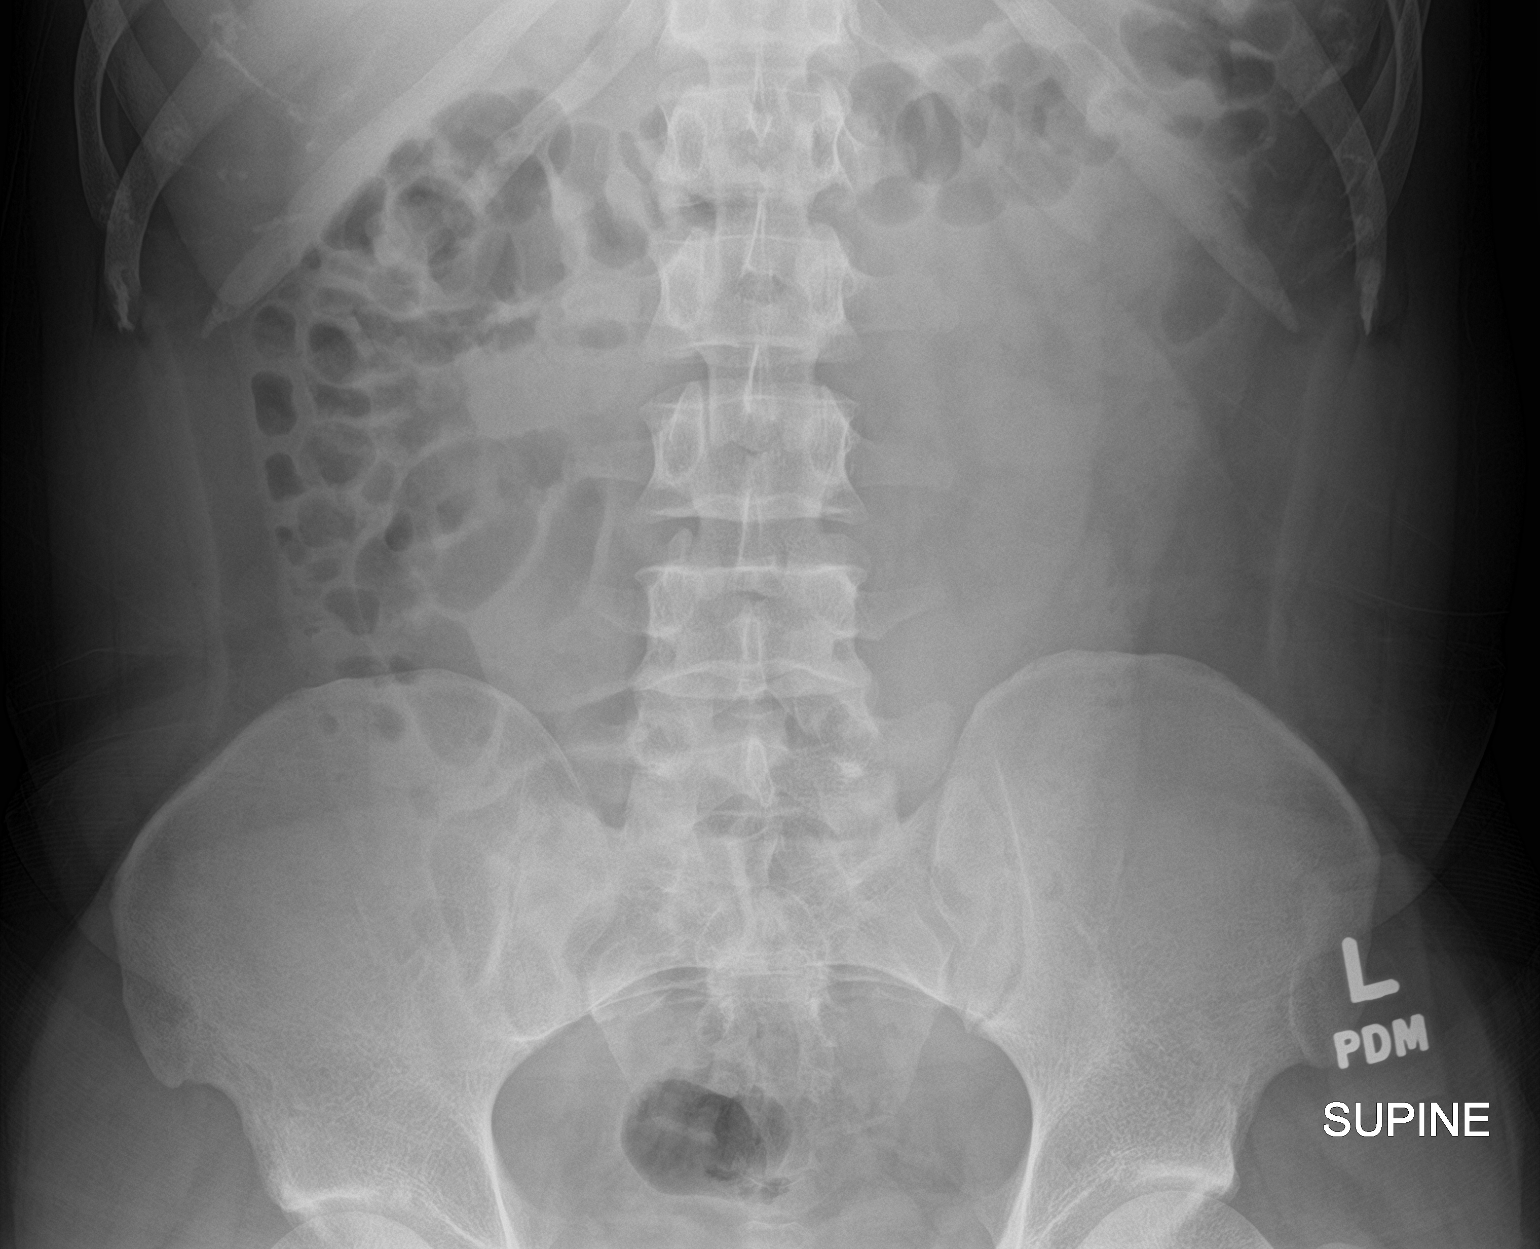

[2 of 2 positions shown; findings below may reference images not displayed]

FINDINGS: There is a nonobstructive bowel gas pattern. There is no abnormally
large colonic stool burden. There is no gross organomegaly or
abnormal soft tissue calcification.

The imaged lung bases are clear.  The bones are unremarkable.
IMPRESSION: Unremarkable KUB. Nonobstructive bowel gas pattern and no abnormally
large colonic stool burden.

## 2024-02-22 ENCOUNTER — Encounter (HOSPITAL_COMMUNITY): Payer: Self-pay | Admitting: *Deleted

## 2024-02-22 ENCOUNTER — Ambulatory Visit (HOSPITAL_COMMUNITY)
Admission: EM | Admit: 2024-02-22 | Discharge: 2024-02-22 | Disposition: A | Discharge status: Home / Self Care | Payer: BLUE CROSS/BLUE SHIELD | Attending: Physician Assistant | Admitting: Physician Assistant

## 2024-02-22 DIAGNOSIS — K111 Hypertrophy of salivary gland: Secondary | ICD-10-CM | POA: Diagnosis not present

## 2024-02-22 LAB — POCT MONO SCREEN (KUC): Mono, POC: NEGATIVE

## 2024-02-22 LAB — POCT RAPID STREP A (OFFICE): Rapid Strep A Screen: NEGATIVE

## 2024-02-22 MED ORDER — AMOXICILLIN-POT CLAVULANATE 875-125 MG PO TABS: 1.0000 | ORAL_TABLET | Freq: Two times a day (BID) | ORAL | 0 refills | Status: DC

## 2024-02-22 NOTE — ED Provider Notes (Signed)
 [facility]    CSN: [id number] Arrival date & time: 02/22/24  1215      History   Chief Complaint Chief Complaint  Patient presents with   Mouth Lesions    HPI Antonio Quinn. is a 34 y.o. male.   HPI  Patient reports the glands under his tongue are swollen and painful  He has never had anything like this previously He denies dry mouth, reduced saliva, bad taste or odor in mouth  He reports this seems like it is getting worse  He has not taken anything for his symptoms    He denies recent travel or recent sick contacts. He denies known exposure to anyone with measles or mumps    History reviewed. No pertinent past medical history.  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 Authorizing Provider  amoxicillin-clavulanate (AUGMENTIN) 875-125 MG tablet Take 1 tablet by mouth every 12 (twelve) hours. 02/22/24  Yes Nasia Cannan E, PA-C  methocarbamol (ROBAXIN) 500 MG tablet Take 1 tablet (500 mg total) by mouth 2 (two) times daily. 07/11/23   Melene Plan, DO  NEOMYCIN-POLYMYXIN-HYDROCORTISONE (CORTISPORIN) 1 % SOLN OTIC solution Apply 1-2 drops to toe BID after soaking 05/14/23   Hyatt, Max T, DPM    Family History Family History  Problem Relation Age of Onset   Diabetes Mother    Diabetes Father    Hypertension Father    Colon cancer Neg Hx    Stomach cancer Neg Hx    Esophageal cancer Neg Hx    Colon polyps Neg Hx     Social History Social History   Tobacco Use   Smoking status: Never   Smokeless tobacco: Never  Vaping Use   Vaping status: Every Day  Substance  Use Topics   Alcohol use: Yes    Comment: 4/7 days a week.   Drug use: Never     Allergies   Kiwi extract   Review of Systems Review of Systems  Constitutional:  Negative for chills and fever.  HENT:  Positive for rhinorrhea, sinus pressure and sinus pain. Negative for congestion, ear pain, mouth sores and sore throat.        Swelling along underside of tongue and chin   Respiratory:  Negative for cough, shortness of breath and wheezing.   Gastrointestinal:  Negative for diarrhea, nausea and vomiting.  Musculoskeletal:  Negative for myalgias.  Skin:  Negative for rash.  Neurological:  Negative for dizziness and headaches.     Physical Exam Triage Vital Signs ED Triage Vitals  Encounter Vitals Group     BP 02/22/24 1248 (!) 156/89     Systolic BP Percentile --      Diastolic BP Percentile --      Pulse Rate 02/22/24 1248 85     Resp 02/22/24 1248 18     Temp 02/22/24 1248 99.2 F (37.3 C)     Temp Source 02/22/24 1248 Oral     SpO2 02/22/24 1248 96 %     Weight --      Height --  Head Circumference --      Peak Flow --      Pain Score 02/22/24 1247 6     Pain Loc --      Pain Education --      Exclude from Growth Chart --    No data found.  Updated Vital Signs BP (!) 156/89 (BP Location: Right Arm)   Pulse 85   Temp 99.2 F (37.3 C) (Oral)   Resp 18   SpO2 96%   Visual Acuity Right Eye Distance:   Left Eye Distance:   Bilateral Distance:    Right Eye Near:   Left Eye Near:    Bilateral Near:     Physical Exam Vitals reviewed.  Constitutional:      General: He is awake.     Appearance: Normal appearance. He is well-developed and well-groomed.  HENT:     Head: Normocephalic and atraumatic.     Mouth/Throat:     Lips: Pink.     Mouth: Mucous membranes are moist. No oral lesions or angioedema.     Tongue: No lesions. Tongue does not deviate from midline.     Pharynx: Oropharynx is clear. Uvula midline. No pharyngeal swelling, oropharyngeal  exudate, posterior oropharyngeal erythema, uvula swelling or postnasal drip.     Tonsils: No tonsillar exudate.     Comments: Physical exam is notable for mild swelling bilaterally along sublingual ducts.  There is not appear to be obstructing stone, purulent drainage, tongue displacement. There is notable swelling and some tenderness along submental space.  Unable to fully palpate individual lymph nodes in this area due to swelling. Eyes:     General: Lids are normal. Gaze aligned appropriately.  Pulmonary:     Effort: Pulmonary effort is normal.  Musculoskeletal:     Cervical back: Normal range of motion and neck supple.  Lymphadenopathy:     Head:     Right side of head: Submental adenopathy present. No submandibular or preauricular adenopathy.     Left side of head: Submental adenopathy present. No submandibular or preauricular adenopathy.     Cervical:     Right cervical: No superficial cervical adenopathy.    Left cervical: No superficial cervical adenopathy.  Neurological:     Mental Status: He is alert.  Psychiatric:        Behavior: Behavior is cooperative.     UC Treatments / Results  Labs (all labs ordered are listed, but only abnormal results are displayed) Labs Reviewed  POCT MONO SCREEN [facility])  POCT RAPID STREP A (OFFICE)    EKG   Radiology No results found.  Procedures Procedures (including critical care time)  Medications Ordered in UC Medications - No data to display  Initial Impression / Assessment and Plan / UC Course  I have reviewed the triage vital signs and the nursing notes.  Pertinent labs & imaging results that were available during my care of the patient were reviewed by me and considered in my medical decision making (see chart for details).      Final Clinical Impressions(s) / UC Diagnoses   Final diagnoses:  Salivary gland enlargement   Acute, new concern Patient reports that he is having swelling in the salivary glands need this  time for the past 3 days.  Physical exam is notable for bilateral swelling of the submandibular salivary glands as well as mild swelling beneath the chin.  Rapid mono and strep testing were negative.  Patient denies recent sick contacts.  Given bilateral  nature as well as symptoms and vitals I am concerned for potential bacterial infection.  Will send in Augmentin p.o. twice daily x 7 days for management.  Recommend over-the-counter Tylenol and ibuprofen as needed for pain management.  ED return precautions reviewed and provided in after visit summary.  Follow-up as needed for progressing or persistent symptoms    Discharge Instructions      At this time I suspect that you may have an infection in your submandibular salivary glands.  I have sent in a medication called Augmentin.  This is an antibiotic that you should take by mouth twice per day for the next 7 days.  You can also use sour candies to help facilitate saliva production.  I do recommend using Tylenol and ibuprofen as needed for further pain management.  If at any point you experience severe swelling under your chin, drooling, trouble breathing, a bad taste in your mouth as well as high fevers please go to the emergency room for further evaluation and management.     ED Prescriptions     Medication Sig Dispense Auth. Provider   amoxicillin-clavulanate (AUGMENTIN) 875-125 MG tablet Take 1 tablet by mouth every 12 (twelve) hours. 14 tablet Jovani Colquhoun E, PA-C      PDMP not reviewed this encounter.   Roselind Messier 02/22/24 2036

## 2024-02-22 NOTE — Discharge Instructions (Signed)
 At this time I suspect that you may have an infection in your submandibular salivary glands.  I have sent in a medication called Augmentin.  This is an antibiotic that you should take by mouth twice per day for the next 7 days.  You can also use sour candies to help facilitate saliva production.  I do recommend using Tylenol and ibuprofen as needed for further pain management.  If at any point you experience severe swelling under your chin, drooling, trouble breathing, a bad taste in your mouth as well as high fevers please go to the emergency room for further evaluation and management.

## 2024-02-22 NOTE — ED Triage Notes (Signed)
 Pt states his glands under his tongue are swollen and have been for 3 days. He hasn't used any meds just tried to stay hydrated.

## 2024-02-24 ENCOUNTER — Encounter (HOSPITAL_COMMUNITY): Payer: Self-pay

## 2024-02-24 ENCOUNTER — Emergency Department (HOSPITAL_COMMUNITY): Payer: BLUE CROSS/BLUE SHIELD

## 2024-02-24 ENCOUNTER — Emergency Department (HOSPITAL_COMMUNITY)
Admission: EM | Admit: 2024-02-24 | Discharge: 2024-02-24 | Disposition: A | Discharge status: Home / Self Care | Payer: BLUE CROSS/BLUE SHIELD | Attending: Emergency Medicine | Admitting: Emergency Medicine

## 2024-02-24 ENCOUNTER — Other Ambulatory Visit: Payer: Self-pay

## 2024-02-24 DIAGNOSIS — R220 Localized swelling, mass and lump, head: Secondary | ICD-10-CM

## 2024-02-24 DIAGNOSIS — K1120 Sialoadenitis, unspecified: Secondary | ICD-10-CM | POA: Insufficient documentation

## 2024-02-24 DIAGNOSIS — R509 Fever, unspecified: Secondary | ICD-10-CM

## 2024-02-24 LAB — CBC WITH DIFFERENTIAL/PLATELET
Abs Immature Granulocytes: 0.05 10*3/uL (ref 0.00–0.07)
Basophils Absolute: 0 10*3/uL (ref 0.0–0.1)
Basophils Relative: 0 %
Eosinophils Absolute: 0.3 10*3/uL (ref 0.0–0.5)
Eosinophils Relative: 3 %
HCT: 43.5 % (ref 39.0–52.0)
Hemoglobin: 14.1 g/dL (ref 13.0–17.0)
Immature Granulocytes: 1 %
Lymphocytes Relative: 8 %
Lymphs Abs: 0.8 10*3/uL (ref 0.7–4.0)
MCH: 28.6 pg (ref 26.0–34.0)
MCHC: 32.4 g/dL (ref 30.0–36.0)
MCV: 88.2 fL (ref 80.0–100.0)
Monocytes Absolute: 0.7 10*3/uL (ref 0.1–1.0)
Monocytes Relative: 6 %
Neutro Abs: 8.5 10*3/uL — ABNORMAL HIGH (ref 1.7–7.7)
Neutrophils Relative %: 82 %
Platelets: 473 10*3/uL — ABNORMAL HIGH (ref 150–400)
RBC: 4.93 MIL/uL (ref 4.22–5.81)
RDW: 11.9 % (ref 11.5–15.5)
WBC: 10.3 10*3/uL (ref 4.0–10.5)
nRBC: 0 % (ref 0.0–0.2)

## 2024-02-24 LAB — RESP PANEL BY RT-PCR (RSV, FLU A&B, COVID)  RVPGX2
Influenza A by PCR: NEGATIVE
Influenza B by PCR: NEGATIVE
Resp Syncytial Virus by PCR: NEGATIVE
SARS Coronavirus 2 by RT PCR: NEGATIVE

## 2024-02-24 LAB — BASIC METABOLIC PANEL
Anion gap: 9 (ref 5–15)
BUN: 10 mg/dL (ref 6–20)
CO2: 26 mmol/L (ref 22–32)
Calcium: 9.6 mg/dL (ref 8.9–10.3)
Chloride: 101 mmol/L (ref 98–111)
Creatinine, Ser: 0.77 mg/dL (ref 0.61–1.24)
GFR, Estimated: >60 mL/min (ref >60)
Glucose, Bld: 87 mg/dL (ref 70–99)
Potassium: 3.9 mmol/L (ref 3.5–5.1)
Sodium: 136 mmol/L (ref 135–145)

## 2024-02-24 LAB — URINALYSIS, ROUTINE W REFLEX MICROSCOPIC
Bacteria, UA: NONE SEEN
Bilirubin Urine: NEGATIVE
Glucose, UA: NEGATIVE mg/dL
Ketones, ur: NEGATIVE mg/dL
Leukocytes,Ua: NEGATIVE
Nitrite: NEGATIVE
Protein, ur: NEGATIVE mg/dL
Specific Gravity, Urine: 1.019 (ref 1.005–1.030)
pH: 6 (ref 5.0–8.0)

## 2024-02-24 MED ORDER — CEPHALEXIN 500 MG PO CAPS: 500.0000 mg | ORAL_CAPSULE | Freq: Four times a day (QID) | ORAL | 0 refills | Status: DC

## 2024-02-24 MED ORDER — DIPHENHYDRAMINE HCL 25 MG PO CAPS
25.0000 mg | ORAL_CAPSULE | Freq: Once | ORAL | Status: AC
  Administered 2024-02-24: 25 mg via ORAL
  Filled 2024-02-24: qty 1

## 2024-02-24 MED ORDER — ACETAMINOPHEN 325 MG PO TABS
650.0000 mg | ORAL_TABLET | Freq: Once | ORAL | Status: AC
  Administered 2024-02-24: 650 mg via ORAL
  Filled 2024-02-24: qty 2

## 2024-02-24 MED ORDER — OXYCODONE HCL 5 MG PO TABS: 5.0000 mg | ORAL_TABLET | Freq: Four times a day (QID) | ORAL | 0 refills | Status: DC | PRN

## 2024-02-24 MED ORDER — IOHEXOL 300 MG/ML  SOLN
75.0000 mL | Freq: Once | INTRAMUSCULAR | Status: AC | PRN
  Administered 2024-02-24: 75 mL via INTRAVENOUS

## 2024-02-24 NOTE — ED Provider Notes (Signed)
  EMERGENCY DEPARTMENT AT [facility] Provider Note   CSN: [id number] Arrival date & time: 02/24/24  1308     History  Chief Complaint  Patient presents with   Facial Swelling    Antonio Quinn. is a 34 y.o. male.  HPI Presents to the ED today complaining swelling under tongue, bilateral periorbital swelling x5 days. Was seen by UC 2 days ago, and sent home on augmentin for possible infection concerns. Strep and mono both negative.  States that the pain under his tongue is most bothersome.  Denies any recent switches to new soaps, foods, substances, environments.   Denies fever, headache, vision changes, chills, cough, congestion, chest pain, shortness of breath, weakness.     Home Medications Prior to Admission medications   Medication Sig Start Date End Date Taking? Authorizing Provider  amoxicillin-clavulanate (AUGMENTIN) 875-125 MG tablet Take 1 tablet by mouth every 12 (twelve) hours. 02/22/24   Mecum, Erin E, PA-C  methocarbamol (ROBAXIN) 500 MG tablet Take 1 tablet (500 mg total) by mouth 2 (two) times daily. 07/11/23   Melene Plan, DO  NEOMYCIN-POLYMYXIN-HYDROCORTISONE (CORTISPORIN) 1 % SOLN OTIC solution Apply 1-2 drops to toe BID after soaking 05/14/23   Hyatt, Max T, DPM      Allergies    Kiwi extract    Review of Systems   Review of Systems  HENT:  Positive for facial swelling and mouth sores.   All other systems reviewed and are negative.   Physical Exam Updated Vital Signs BP (!) 141/86 (BP Location: Left Arm)   Pulse 96   Temp (!) 101.2 F (38.4 C) (Oral)   Resp 17   Ht 5\' 9"  (1.753 m)   Wt 95.3 kg   SpO2 100%   BMI 31.01 kg/m  Physical Exam Vitals and nursing note reviewed.  Constitutional:      Appearance: Normal appearance.  HENT:     Head: Normocephalic and atraumatic.     Nose: Nose normal. No congestion.     Mouth/Throat:     Mouth: Mucous membranes are moist.     Pharynx: Oropharynx is clear. No oropharyngeal  exudate.     Comments: Patient noted to have bilateral sublingual swelling and tenderness to salivary glands.  Eyes:     General: No scleral icterus.       Right eye: No discharge.        Left eye: No discharge.     Extraocular Movements: Extraocular movements intact.     Conjunctiva/sclera: Conjunctivae normal.     Pupils: Pupils are equal, round, and reactive to light.     Comments: Patient is noted to have left-sided periorbital swelling and mild tenderness to palpation including left eyelid.  Right side mild periorbital swelling noted but not as severe as left.  Cardiovascular:     Rate and Rhythm: Normal rate and regular rhythm.     Pulses: Normal pulses.     Heart sounds: Normal heart sounds. No murmur heard.    No friction rub. No gallop.  Pulmonary:     Effort: Pulmonary effort is normal. No respiratory distress.     Breath sounds: Normal breath sounds. No stridor. No wheezing or rales.  Abdominal:     General: Abdomen is flat.     Palpations: Abdomen is soft.     Tenderness: There is no abdominal tenderness.  Skin:    General: Skin is warm and dry.     Coloration: Skin is not jaundiced or  pale.     Findings: No erythema.  Neurological:     General: No focal deficit present.     Mental Status: He is alert and oriented to person, place, and time. Mental status is at baseline.     Motor: No weakness.  Psychiatric:        Mood and Affect: Mood normal.     ED Results / Procedures / Treatments   Labs (all labs ordered are listed, but only abnormal results are displayed) Labs Reviewed  CBC WITH DIFFERENTIAL/PLATELET - Abnormal; Notable for the following components:      Result Value   Platelets 473 (*)    Neutro Abs 8.5 (*)    All other components within normal limits  URINALYSIS, ROUTINE W REFLEX MICROSCOPIC - Abnormal; Notable for the following components:   Hgb urine dipstick SMALL (*)    All other components within normal limits  RESP PANEL BY RT-PCR (RSV, FLU  A&B, COVID)  RVPGX2  BASIC METABOLIC PANEL    EKG None  Radiology No results found.  Procedures Procedures    Medications Ordered in ED Medications  diphenhydrAMINE (BENADRYL) capsule 25 mg (25 mg Oral Given 02/24/24 1012)  iohexol (OMNIPAQUE) 300 MG/ML solution 75 mL (75 mLs Intravenous Contrast Given 02/24/24 1407)  acetaminophen (TYLENOL) tablet 650 mg (650 mg Oral Given 02/24/24 1432)    ED Course/ Medical Decision Making/ A&P                                 Medical Decision Making Amount and/or Complexity of Data Reviewed Labs: ordered. Radiology: ordered.  Risk Prescription drug management.   This patient is a 34 year old male who presents to the ED for concern of facial swelling and subLingual swelling/pain.   Differential diagnoses prior to evaluation: The emergent differential diagnosis includes, but is not limited to,  Sialoadenitis, Ludwig's angina, peritonsillar abscess, periorbital cellulitis, orbital cellulitis, peritonsillar abscess, sialaodentis, silaolithiasis, allergic reaction.  . This is not an exhaustive differential.   Past Medical History / Co-morbidities / Social History: Pre-op pain, hematuria, astigmatism.  Additional history: Chart reviewed. Pertinent results include:   Recently seen on 02/22/2024 for similar adenitis and prescribed Augmentin with submental adenopathy noted bilaterally.  Patient was noted to have bilateral swelling with a negative strep test and negative Monospot test.  No facial swelling present at that time.  Lab Tests/Imaging studies: I personally interpreted labs/imaging and the pertinent results include:   CBC was notable for an elevated platelet of 473 as well as a elevated neutrophil count of 8.5. UA unremarkable BMP unremarkable  CT maxillofacial pending   Medications: I ordered medication including Tylenol for fever.  I have reviewed the patients home medicines and have made adjustments as needed.  ED Course:   Patient is a 34 male presents the ED today complaining of a 5-day history of sublingual salivary gland swelling and pain as well as new onset of facial swelling x 2 days.    Patient is noted to have left-sided periorbital edema and mild tenderness to palpation with no proptosis, chemosis, pain with EOMs, visual changes.  On initial physical exam, patient is afebrile, no acute distress, complaining mostly of sublingual pain.  Bilateral submental adenopathy noted.  No voice change present this time.  Uvula is midline.   CT maxillofacial with contrast was ordered as well as baseline labs. Labs are unremarkable, CT maxillofacial pending at this time.  Also with  respiratory failure due to becoming febrile on reevaluation.  Labs unremarkable. On reevaluation, patient was noted to be febrile temperature of 101.2.  Patient was given Tylenol.   Disposition: 3:14 PM Care of Dierre Crevier. transferred to PA Amjad and Dr. Doran Durand at the end of my shift as the patient will require reassessment once labs/imaging have resulted. Patient presentation, ED course, and plan of care discussed with review of all pertinent labs and imaging. Please see his/her note for further details regarding further ED course and disposition. Plan at time of handoff is evaluate CT for abscess or infection causing facial edema, add on clindamycin if present, await resp. Panel. This may be altered or completely changed at the discretion of the oncoming team pending results of further workup.    Final Clinical Impression(s) / ED Diagnoses Final diagnoses:  Facial swelling  Sialadenitis    Rx / DC Orders ED Discharge Orders          Ordered    cephALEXin (KEFLEX) 500 MG capsule  4 times daily,   Status:  Discontinued        02/24/24 1452              Lunette Stands, PA-C 02/24/24 1515    Anders Simmonds T, DO 02/26/24 [phone]

## 2024-02-24 NOTE — ED Triage Notes (Addendum)
 Patient said his eyes have been swollen since Friday along with under his tongue swelling. Stated the left eye is getting worse. Was prescribed amoxicillin but no improvement. Feels like his airway is open and no swelling noted in triage. No change in vision.

## 2024-02-24 NOTE — Discharge Instructions (Addendum)
 Your workup today was reassuring.  He did have a fever.  Not exactly sure where this is coming from.  This could be early developing cellulitis which is causing the fever.  You are on a pretty broad-spectrum antibiotic with Augmentin.  Please continue this.  You have had 3 doses so far.  However if symptoms worsen or you do not improve any fevers continue please return to the emergency room for evaluation.  Otherwise follow-up with [facility].  No signs of severe infection called sepsis at this time.

## 2024-02-24 NOTE — ED Provider Notes (Signed)
 34 year old male presents today for concern of facial swelling and pain.  See previous note for full details.  Patient received in signout.  Patient awaiting CT scan and respiratory panel. Physical Exam  BP (!) 141/86 (BP Location: Left Arm)   Pulse 96   Temp (!) 101.2 F (38.4 C) (Oral)   Resp 17   Ht 5\' 9"  (1.753 m)   Wt 95.3 kg   SpO2 100%   BMI 31.01 kg/m     Procedures  Procedures  ED Course / MDM    Medical Decision Making Amount and/or Complexity of Data Reviewed Labs: ordered. Radiology: ordered.  Risk OTC drugs. Prescription drug management.   CT scan without concerning findings.  Patient received Tylenol for his fever.  Respiratory panel negative.  Unsure of his fever cause.  Could be early developing cellulitis around his mouth.  He does have swelling and was diagnosed with sialadenitis a couple days ago.  He has taken 3 doses of Augmentin.  Feel this is a broad-spectrum antibiotic that he can continue especially since he has only had 3 doses.  No indication to switch right now.  Discussed with attending.  Discussed obtaining blood cultures for his fever however ultimately this was deferred given he is otherwise well-appearing and no signs of sepsis.  Patient will follow-up closely with the internal medicine clinic.  If he is unable to get a close follow-up he will return to the emergency department to be reevaluated.       Marita Kansas, PA-C 02/24/24 1832    Cathren Laine, MD 02/25/24 [phone]

## 2024-03-04 ENCOUNTER — Other Ambulatory Visit: Payer: Self-pay

## 2024-03-04 ENCOUNTER — Emergency Department (HOSPITAL_COMMUNITY)
Admission: EM | Admit: 2024-03-04 | Discharge: 2024-03-04 | Disposition: A | Discharge status: Home / Self Care | Attending: Emergency Medicine | Admitting: Emergency Medicine

## 2024-03-04 DIAGNOSIS — H04123 Dry eye syndrome of bilateral lacrimal glands: Secondary | ICD-10-CM

## 2024-03-04 DIAGNOSIS — H04129 Dry eye syndrome of unspecified lacrimal gland: Secondary | ICD-10-CM | POA: Insufficient documentation

## 2024-03-04 MED ORDER — ERYTHROMYCIN 5 MG/GM OP OINT: TOPICAL_OINTMENT | OPHTHALMIC | 0 refills | Status: DC

## 2024-03-04 NOTE — ED Triage Notes (Addendum)
 Pt c/o dry eyes for 2x weeks, vision gets blurry "like I have sleep in my eyes".  L eye is painful   AOx4

## 2024-03-04 NOTE — Discharge Instructions (Addendum)
 Use your artifical tears in the daytime and consider using the ointment prescribed before bed at night.  Follow up with your primary care clinic also about your dry eyes and mouth.  This may also be related to certain medical conditions (like Sjogren's syndrome) which is an autoimmune condition.

## 2024-03-04 NOTE — ED Provider Notes (Signed)
  EMERGENCY DEPARTMENT AT [facility] Provider Note   CSN: [id number] Arrival date & time: 03/04/24  4098     History  Chief Complaint  Patient presents with   dry eyes   Eye Pain    Antonio Quinn. is a 34 y.o. male presented to ED complaining of dry eyes.  Patient reports he was seen in the ED for dry eyes and dry mouth about a week ago.  He says he feels like he is not producing tears having difficulty producing saliva.  He does wear corrective lenses for nearsightedness.  He denies any prior history of eye surgery, eye trauma, or eye problems.  He says occasionally his left eye will get sore and blurry vision, particularly after using drops of blinking.  He did just prior teardrops today.  HPI     Home Medications Prior to Admission medications   Medication Sig Start Date End Date Taking? Authorizing Provider  erythromycin ophthalmic ointment Place a 1/2 inch ribbon of ointment into the lower eyelid of both eyes nightly for 7 days 03/04/24  Yes Slater Mcmanaman, Kermit Balo, MD  amoxicillin-clavulanate (AUGMENTIN) 875-125 MG tablet Take 1 tablet by mouth every 12 (twelve) hours. 02/22/24   Mecum, Erin E, PA-C  methocarbamol (ROBAXIN) 500 MG tablet Take 1 tablet (500 mg total) by mouth 2 (two) times daily. 07/11/23   Melene Plan, DO  NEOMYCIN-POLYMYXIN-HYDROCORTISONE (CORTISPORIN) 1 % SOLN OTIC solution Apply 1-2 drops to toe BID after soaking 05/14/23   Hyatt, Max T, DPM  oxyCODONE (ROXICODONE) 5 MG immediate release tablet Take 1 tablet (5 mg total) by mouth every 6 (six) hours as needed for severe pain (pain score 7-10). 02/24/24   Marita Kansas, PA-C      Allergies    Kiwi extract    Review of Systems   Review of Systems  Physical Exam Updated Vital Signs BP (!) 153/83 (BP Location: Right Arm)   Pulse 95   Temp 98.8 F (37.1 C)   Resp 18   SpO2 100%  Physical Exam Constitutional:      General: He is not in acute distress. HENT:     Head: Normocephalic and  atraumatic.  Eyes:     General: No scleral icterus.       Right eye: No discharge.        Left eye: No discharge.     Extraocular Movements: Extraocular movements intact.     Conjunctiva/sclera: Conjunctivae normal.     Pupils: Pupils are equal, round, and reactive to light.  Cardiovascular:     Rate and Rhythm: Normal rate and regular rhythm.  Pulmonary:     Effort: Pulmonary effort is normal. No respiratory distress.  Skin:    General: Skin is warm and dry.  Neurological:     General: No focal deficit present.     Mental Status: He is alert. Mental status is at baseline.  Psychiatric:        Mood and Affect: Mood normal.        Behavior: Behavior normal.     ED Results / Procedures / Treatments   Labs (all labs ordered are listed, but only abnormal results are displayed) Labs Reviewed - No data to display  EKG None  Radiology No results found.  Procedures Procedures    Medications Ordered in ED Medications - No data to display  ED Course/ Medical Decision Making/ A&P  Medical Decision Making  Patient is presenting to the ER with dry eyes.  No evidence of corneal abrasion per exam or history.  I doubt GCA, stroke, retinal detachment.  No evidence of acute infection at this time.  I do question the possibility of an autoimmune disorder such as Sjogren syndrome given his report for difficulty with elevation as well.  There is no family history or personal history to suggest this prior to that.  But he would need an outpatient workup.  I can refer him to ophthalmology for additional ophthalmologic [facility] evaluation, and in the meantime advised that he use artificial tears in the daytime, consider using erythromycin ointment at night which may provide longer lubricating relief in bedtime.        Final Clinical Impression(s) / ED Diagnoses Final diagnoses:  Dry eyes    Rx / DC Orders ED Discharge Orders          Ordered     erythromycin ophthalmic ointment        03/04/24 0825              Terald Sleeper, MD 03/04/24 [phone]

## 2024-09-02 ENCOUNTER — Encounter (HOSPITAL_COMMUNITY): Payer: Self-pay | Admitting: Emergency Medicine

## 2024-09-02 ENCOUNTER — Other Ambulatory Visit: Payer: Self-pay

## 2024-09-02 ENCOUNTER — Ambulatory Visit (HOSPITAL_COMMUNITY)
Admission: EM | Admit: 2024-09-02 | Discharge: 2024-09-02 | Disposition: A | Discharge status: Home / Self Care | Source: Ambulatory Visit | Attending: Family Medicine | Admitting: Family Medicine

## 2024-09-02 DIAGNOSIS — J029 Acute pharyngitis, unspecified: Secondary | ICD-10-CM

## 2024-09-02 DIAGNOSIS — J020 Streptococcal pharyngitis: Secondary | ICD-10-CM | POA: Diagnosis not present

## 2024-09-02 LAB — POCT RAPID STREP A (OFFICE): Rapid Strep A Screen: POSITIVE — AB

## 2024-09-02 MED ORDER — AMOXICILLIN 875 MG PO TABS: 875.0000 mg | ORAL_TABLET | Freq: Two times a day (BID) | ORAL | 0 refills | Status: AC

## 2024-09-02 NOTE — ED Provider Notes (Signed)
 [facility]   [id number] 09/02/24 Arrival Time: 0936  ASSESSMENT & PLAN:  1. Streptococcal sore throat   2. Sore throat    No signs of peritonsillar abscess. Begin: Meds ordered this encounter  Medications   amoxicillin  (AMOXIL ) 875 MG tablet    Sig: Take 1 tablet (875 mg total) by mouth 2 (two) times daily for 10 days.    Dispense:  20 tablet    Refill:  0    Results for orders placed or performed during the hospital encounter of 09/02/24  POC rapid strep A   Collection Time: 09/02/24 11:57 AM  Result Value Ref Range   Rapid Strep A Screen Positive (A) Negative   Labs Reviewed  POCT RAPID STREP A (OFFICE) - Abnormal; Notable for the following components:      Result Value   Rapid Strep A Screen Positive (*)    All other components within normal limits    OTC analgesics and throat care as needed  Instructed to finish full 10 day course of antibiotics. Will follow up if not showing significant improvement over the next 24-48 hours.   Reviewed expectations re: course of current medical issues. Questions answered. Outlined signs and symptoms indicating need for more acute intervention. Patient verbalized understanding. After Visit Summary given.   SUBJECTIVE:  Antonio E Mertens Jr. is a 34 y.o. male who reports a sore throat; abrupt onset; x few days. Tmax 101.57F. Tolerating PO intake. Tylenol  with some help.   OBJECTIVE:  Vitals:   09/02/24 1154  BP: 117/79  Pulse: 76  Resp: 18  Temp: 98.1 F (36.7 C)  TempSrc: Oral  SpO2: 97%     General appearance: alert; no distress HEENT: throat with marked erythema and without tonsillar hypertrophy; uvula is midline Neck: supple with FROM; no lymphadenopathy Lungs: speaks full sentences without difficulty; unlabored Abd: soft; non-tender Skin: reveals no rash; warm and dry Psychological: alert and cooperative; normal mood and affect  Allergies  Allergen Reactions   Kiwi Extract     History reviewed.  No pertinent past medical history. Social History   Socioeconomic History   Marital status: Married    Spouse name: Not on file   Number of children: 1   Years of education: Not on file   Highest education level: Not on file  Occupational History   Not on file  Tobacco Use   Smoking status: Never   Smokeless tobacco: Never  Vaping Use   Vaping status: Some Days  Substance and Sexual Activity   Alcohol use: Yes    Comment: 4/7 days a week.   Drug use: Never   Sexual activity: Yes    Birth control/protection: None  Other Topics Concern   Not on file  Social History Narrative   Not on file   Social Drivers of Health   Financial Resource Strain: Not on file  Food Insecurity: Not on file  Transportation Needs: Not on file  Physical Activity: Not on file  Stress: Not on file  Social Connections: Unknown (05/15/2022)   Received from [facility]   Social Network    Social Network: Not on file  Intimate Partner Violence: Unknown (04/06/2022)   Received from [facility]   HITS    Physically Hurt: Not on file    Insult or Talk Down To: Not on file    Threaten Physical Harm: Not on file    Scream or Curse: Not on file   Family History  Problem Relation Age  of Onset   Diabetes Mother    Diabetes Father    Hypertension Father    Colon cancer Neg Hx    Stomach cancer Neg Hx    Esophageal cancer Neg Hx    Colon polyps Neg Hx            Rolinda Rogue, MD 09/02/24 1309

## 2024-09-02 NOTE — ED Triage Notes (Signed)
 Complains of swollen tonsils, painful.  Noticed Sunday morning.  Patient reports a 101.8 fever on Monday.    Patient has been taking tylenol .    Patient reports testing for covid and flu yesterday -both were negative

## 2024-11-06 ENCOUNTER — Encounter (HOSPITAL_COMMUNITY): Payer: Self-pay

## 2024-11-06 ENCOUNTER — Ambulatory Visit (HOSPITAL_COMMUNITY)
Admission: EM | Admit: 2024-11-06 | Discharge: 2024-11-06 | Disposition: A | Discharge status: Home / Self Care | Attending: Physician Assistant | Admitting: Physician Assistant

## 2024-11-06 DIAGNOSIS — J209 Acute bronchitis, unspecified: Secondary | ICD-10-CM

## 2024-11-06 DIAGNOSIS — J029 Acute pharyngitis, unspecified: Secondary | ICD-10-CM | POA: Diagnosis not present

## 2024-11-06 MED ORDER — AMOXICILLIN 500 MG PO CAPS: 500.0000 mg | ORAL_CAPSULE | Freq: Three times a day (TID) | ORAL | 0 refills | Status: AC

## 2024-11-06 NOTE — Discharge Instructions (Signed)
 Return if any problems.

## 2024-11-06 NOTE — ED Triage Notes (Signed)
 Coughing up green mucus x 3 days. Patient denies any N/V/D. Patient is taking OTC Tylenol .

## 2024-11-06 NOTE — ED Provider Notes (Signed)
 [facility]    CSN: [id number] Arrival date & time: 11/06/24  0841      History   Chief Complaint Chief Complaint  Patient presents with   Cough   Nasal Congestion    HPI Antonio Dennin. is a 34 y.o. male.   Patient had strep throat a month ago.  He reports that he has continued to have pain on the right side of his throat.  Patient tells me that for the past week he has been coughing.  He has been coughing up green phlegm.  He denies any fever denies any chills.  Patient states he does not feel short of breath.  He has not been around anyone who had flu or COVID.  He finished the antibiotics for strep throat.  Patient denies any history of asthma he has not had a COPD.  Patient is a smoker.  The history is provided by the patient. No language interpreter was used.  Cough   History reviewed. No pertinent past medical history.  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 Authorizing Provider  amoxicillin  (AMOXIL ) 500 MG capsule Take 1 capsule (500 mg total) by mouth 3 (three) times daily. 11/06/24  Yes Flint Sonny POUR, PA-C    Family History Family History  Problem Relation Age of Onset   Diabetes Mother    Diabetes Father    Hypertension Father    Colon cancer Neg Hx    Stomach cancer Neg Hx    Esophageal cancer Neg Hx    Colon polyps Neg Hx     Social History Social History   Tobacco Use   Smoking status: Never   Smokeless tobacco: Never  Vaping Use   Vaping status: Some Days  Substance Use Topics   Alcohol use: Yes    Comment: 4/7 days a week.   Drug use:  Never     Allergies   Kiwi extract   Review of Systems Review of Systems  Respiratory:  Positive for cough.   All other systems reviewed and are negative.    Physical Exam Triage Vital Signs ED Triage Vitals  Encounter Vitals Group     BP 11/06/24 0927 137/86     Girls Systolic BP Percentile --      Girls Diastolic BP Percentile --      Boys Systolic BP Percentile --      Boys Diastolic BP Percentile --      Pulse Rate 11/06/24 0927 69     Resp 11/06/24 0927 18     Temp 11/06/24 0927 98.7 F (37.1 C)     Temp Source 11/06/24 0927 Oral     SpO2 11/06/24 0927 95 %     Weight --      Height --      Head Circumference --      Peak Flow --      Pain Score 11/06/24 0932 0     Pain Loc --      Pain Education --      Exclude from [employer]  Chart --    No data found.  Updated Vital Signs BP 137/86 (BP Location: Left Arm)   Pulse 69   Temp 98.7 F (37.1 C) (Oral)   Resp 18   SpO2 95%   Visual Acuity Right Eye Distance:   Left Eye Distance:   Bilateral Distance:    Right Eye Near:   Left Eye Near:    Bilateral Near:     Physical Exam Vitals and nursing note reviewed.  Constitutional:      Appearance: He is well-developed.  HENT:     Head: Normocephalic.     Mouth/Throat:     Mouth: Mucous membranes are moist.  Eyes:     Pupils: Pupils are equal, round, and reactive to light.  Cardiovascular:     Rate and Rhythm: Normal rate.  Pulmonary:     Effort: Pulmonary effort is normal.  Abdominal:     General: There is no distension.  Musculoskeletal:        General: Normal range of motion.     Cervical back: Normal range of motion.  Skin:    General: Skin is warm.  Neurological:     General: No focal deficit present.     Mental Status: He is alert and oriented to person, place, and time.      UC Treatments / Results  Labs (all labs ordered are listed, but only abnormal results are displayed) Labs Reviewed - No data to display  EKG   Radiology No  results found.  Procedures Procedures (including critical care time)  Medications Ordered in UC Medications - No data to display  Initial Impression / Assessment and Plan / UC Course  I have reviewed the triage vital signs and the nursing notes.  Pertinent labs & imaging results that were available during my care of the patient were reviewed by me and considered in my medical decision making (see chart for details).      Final Clinical Impressions(s) / UC Diagnoses   Final diagnoses:  Acute bronchitis, unspecified organism  Acute pharyngitis, unspecified etiology     Discharge Instructions      Return if any problems.     ED Prescriptions     Medication Sig Dispense Auth. Provider   amoxicillin  (AMOXIL ) 500 MG capsule Take 1 capsule (500 mg total) by mouth 3 (three) times daily. 30 capsule Gar Glance K, PA-C      PDMP not reviewed this encounter.  Patient's symptoms are most consistent with bronchitis.  Patient continues to have pain in his throat.  Patient given a prescription of amoxicillin .  Patient advised if he continues to have throat pain he should schedule to see ENT for further evaluation.  Patient is discharged in stable condition with follow-up plans   Flint Sonny POUR, PA-C 11/06/24 1019
